# Patient Record
Sex: Female | Born: 1965
Health system: Southern US, Community
[De-identification: ages and names within clinical notes are randomized; demographics above are authoritative.]

## PROBLEM LIST (undated history)

## (undated) DIAGNOSIS — T7840XA Allergy, unspecified, initial encounter: Secondary | ICD-10-CM

## (undated) DIAGNOSIS — I872 Venous insufficiency (chronic) (peripheral): Secondary | ICD-10-CM

## (undated) DIAGNOSIS — F32A Depression, unspecified: Secondary | ICD-10-CM

## (undated) DIAGNOSIS — R519 Headache, unspecified: Secondary | ICD-10-CM

## (undated) DIAGNOSIS — F329 Major depressive disorder, single episode, unspecified: Secondary | ICD-10-CM

## (undated) DIAGNOSIS — K5792 Diverticulitis of intestine, part unspecified, without perforation or abscess without bleeding: Secondary | ICD-10-CM

## (undated) DIAGNOSIS — K589 Irritable bowel syndrome without diarrhea: Secondary | ICD-10-CM

## (undated) DIAGNOSIS — R51 Headache: Secondary | ICD-10-CM

## (undated) HISTORY — DX: Major depressive disorder, single episode, unspecified: F32.9

## (undated) HISTORY — DX: Depression, unspecified: F32.A

## (undated) HISTORY — DX: Allergy, unspecified, initial encounter: T78.40XA

## (undated) HISTORY — DX: Irritable bowel syndrome, unspecified: K58.9

---

## 1990-04-17 HISTORY — PX: BACK SURGERY: SHX140

## 1991-04-18 LAB — HM PAP SMEAR: HM Pap smear: NORMAL

## 2013-05-19 ENCOUNTER — Ambulatory Visit: Payer: Self-pay | Admitting: Family Medicine

## 2013-05-21 ENCOUNTER — Encounter: Payer: Self-pay | Admitting: Family Medicine

## 2013-05-21 ENCOUNTER — Ambulatory Visit: Payer: Self-pay | Admitting: Family Medicine

## 2013-05-21 ENCOUNTER — Ambulatory Visit (INDEPENDENT_AMBULATORY_CARE_PROVIDER_SITE_OTHER): Payer: BC Managed Care – PPO | Admitting: Family Medicine

## 2013-05-21 ENCOUNTER — Encounter (INDEPENDENT_AMBULATORY_CARE_PROVIDER_SITE_OTHER): Payer: Self-pay

## 2013-05-21 VITALS — BP 108/72 | HR 77 | Ht 70.0 in | Wt 275.0 lb

## 2013-05-21 DIAGNOSIS — M79609 Pain in unspecified limb: Secondary | ICD-10-CM

## 2013-05-21 DIAGNOSIS — M79672 Pain in left foot: Secondary | ICD-10-CM

## 2013-05-21 DIAGNOSIS — M79671 Pain in right foot: Secondary | ICD-10-CM

## 2013-05-21 NOTE — Progress Notes (Signed)
Patient ID: Rachael Gonzales, female   DOB: 15-Oct-1965, 48 y.o.   MRN: [id number]  PCP: No primary provider on file.  Subjective:   HPI: Patient is a 48 y.o. female here for bilateral heel/foot pain.  Patient denies acute injury. States she has had progressively worsening bilateral heel pain over past 6 weeks. Tried ibuprofen. Left currently worse than the right. No other treatment tried.  History reviewed. No pertinent past medical history.  No current outpatient prescriptions on file prior to visit.   No current facility-administered medications on file prior to visit.    Past Surgical History  Procedure Laterality Date  . Cesarean section    . Back surgery      L5-S1    No Known Allergies  History   Social History  . Marital Status: Significant Other    Spouse Name: N/A    Number of Children: N/A  . Years of Education: N/A   Occupational History  . Not on file.   Social History Main Topics  . Smoking status: Former Research scientist (life sciences)  . Smokeless tobacco: Not on file  . Alcohol Use: Not on file  . Drug Use: Not on file  . Sexual Activity: Not on file   Other Topics Concern  . Not on file   Social History Narrative  . No narrative on file    Family History  Problem Relation Age of Onset  . Sudden death Neg Hx   . Hypertension Neg Hx   . Heart attack Neg Hx   . Diabetes Neg Hx   . Hyperlipidemia Neg Hx     BP 108/72  Pulse 77  Ht 5\' 10"  (1.778 m)  Wt 275 lb (124.739 kg)  BMI 39.46 kg/m2  Review of Systems: See HPI above.    Objective:  Physical Exam:  Gen: NAD  Bilateral feet/ankles: Overpronation. No other gross deformity, swelling, ecchymoses FROM ankles with 5/5 strength all directions. TTP achilles near insertion and proximal plantar fascia. Negative ant drawer and talar tilt.   Negative syndesmotic compression. Thompsons test negative. NV intact distally.    Assessment & Plan:  1. 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  Start home exercise program.  Icing, nsaids.  Avoid flat shoes/barefoot walking.  Heel lifts, better arch support.  Consider PF injection, PT if not improving.  F/u in 6 weeks.

## 2013-05-21 NOTE — Patient Instructions (Signed)
You have a combination of achilles tendinitis and plantar fasciitis Take tylenol or aleve as needed for pain  Achilles stretch against wall and plantar fascia stretch for 20-30 seconds (do 3 of these) in morning Calf raises 3 sets of 10 once a day (start with both feet - when tolerated do a single leg calf raise.  Finally when tolerated raise/lower on a step) Can add heel walks, toe walks forward and backward as well Ice heel for 15 minutes as needed. Avoid flat shoes/barefoot walking as much as possible. Arch straps have been shown to help with pain. Heel lifts may help with pain by avoiding fully stretching the plantar fascia. Orthotics with heel lift may be helpful (green insoles or dr. Zoe Lan active series insoles). Steroid injection is a consideration for short term pain relief if you are struggling. Physical therapy is also an option. Follow up with me in 6 weeks for reevaluation.

## 2013-05-26 DIAGNOSIS — M79672 Pain in left foot: Secondary | ICD-10-CM | POA: Insufficient documentation

## 2013-05-26 DIAGNOSIS — M79671 Pain in right foot: Secondary | ICD-10-CM | POA: Insufficient documentation

## 2013-05-26 NOTE — Assessment & Plan Note (Signed)
elements of both achilles tendinopathy and plantar fasciitis.  Start home exercise program.  Icing, nsaids.  Avoid flat shoes/barefoot walking.  Heel lifts, better arch support.  Consider PF injection, PT if not improving.  F/u in 6 weeks.

## 2013-07-02 ENCOUNTER — Encounter: Payer: Self-pay | Admitting: Family Medicine

## 2013-07-02 ENCOUNTER — Ambulatory Visit (INDEPENDENT_AMBULATORY_CARE_PROVIDER_SITE_OTHER): Payer: BC Managed Care – PPO | Admitting: Family Medicine

## 2013-07-02 ENCOUNTER — Ambulatory Visit: Payer: BC Managed Care – PPO | Admitting: Family Medicine

## 2013-07-02 VITALS — BP 102/68 | HR 76 | Ht 70.0 in | Wt 280.0 lb

## 2013-07-02 DIAGNOSIS — M7710 Lateral epicondylitis, unspecified elbow: Secondary | ICD-10-CM

## 2013-07-02 DIAGNOSIS — M79609 Pain in unspecified limb: Secondary | ICD-10-CM

## 2013-07-02 DIAGNOSIS — M79671 Pain in right foot: Secondary | ICD-10-CM

## 2013-07-02 DIAGNOSIS — M79672 Pain in left foot: Secondary | ICD-10-CM

## 2013-07-02 DIAGNOSIS — M7712 Lateral epicondylitis, left elbow: Secondary | ICD-10-CM

## 2013-07-02 NOTE — Patient Instructions (Signed)
You have lateral epicondylitis Try to avoid painful activities as much as possible. Ice the area 3-4 times a day for 15 minutes at a time. Tylenol or aleve as needed for pain. Counterforce brace as directed can help unload area - wear this regularly if it provides you with relief. Home Pronation/supination with 1 pound weight, wrist extension, stretching - do these once a day. Consider formal PT. Consider injection for short term pain relief if the above is not helping. Follow up with me in 6 weeks or as needed.

## 2013-07-04 ENCOUNTER — Encounter: Payer: Self-pay | Admitting: Family Medicine

## 2013-07-04 DIAGNOSIS — M7712 Lateral epicondylitis, left elbow: Secondary | ICD-10-CM | POA: Insufficient documentation

## 2013-07-04 NOTE — Progress Notes (Signed)
Patient ID: [name], female   DOB: 1965/09/18, 48 y.o.   MRN: [id number]  PCP: No primary provider on file.  Subjective:   HPI: Patient is a 48 y.o. female here for bilateral heel/foot pain.  2/4: Patient denies acute injury. States she has had progressively worsening bilateral heel pain over past 6 weeks. Tried ibuprofen. Left currently worse than the right. No other treatment tried.  3/8: Patient reports her heels have improved - still some pain in left heel Doing home exercises, inserts, arch binders, icing. Takes motrin three times a day. Getting pain in lateral left elbow and forearm now. Gets swelling into hand in mornings after she wears compression wrap. Not had anything like this before.  History reviewed. No pertinent past medical history.  No current outpatient prescriptions on file prior to visit.   No current facility-administered medications on file prior to visit.    Past Surgical History  Procedure Laterality Date  . Cesarean section    . Back surgery      L5-S1    No Known Allergies  History   Social History  . Marital Status: Significant Other    Spouse Name: N/A    Number of Children: N/A  . Years of Education: N/A   Occupational History  . Not on file.   Social History Main Topics  . Smoking status: Former Research scientist (life sciences)  . Smokeless tobacco: Not on file  . Alcohol Use: Not on file  . Drug Use: Not on file  . Sexual Activity: Not on file   Other Topics Concern  . Not on file   Social History Narrative  . No narrative on file    Family History  Problem Relation Age of Onset  . Sudden death Neg Hx   . Hypertension Neg Hx   . Heart attack Neg Hx   . Diabetes Neg Hx   . Hyperlipidemia Neg Hx     BP 102/68  Pulse 76  Ht 5\' 10"  (1.778 m)  Wt 280 lb (127.007 kg)  BMI 40.18 kg/m2  Review of Systems: See HPI above.    Objective:  Physical Exam:  Gen: NAD  Bilateral feet/ankles: Overpronation. No other gross deformity,  swelling, ecchymoses FROM ankles with 5/5 strength all directions. No TTP achilles near insertion.  Minimal tenderness proximal plantar fascia. Thompsons test negative. NV intact distally.  Left elbow: No gross deformity, swelling, bruising. TTP lateral epicondyle. Pain reproduced with resisted wrist extension, less with 3rd digit extensions. NVI distally. Collateral ligaments intact.    Assessment & Plan:  1. Bilateral heel pain - elements of both achilles tendinopathy and plantar fasciitis.  Much improved.  Continue HEP, icing, nsaids.  Avoid flat shoes/barefoot walking.  Heel lifts, better arch support.  Consider PF injection, PT if not improving.  F/u prn for this issue.  2. Lateral epicondylitis - Icing, nsaids.  Counterforce brace.  Home exercise program reviewed.  Consider physical therapy, nitro patches, injection if not improving.  F/u in 6 weeks or prn.

## 2013-07-04 NOTE — Assessment & Plan Note (Signed)
Icing, nsaids.  Counterforce brace.  Home exercise program reviewed.  Consider physical therapy, nitro patches, injection if not improving.  F/u in 6 weeks or prn.

## 2013-07-04 NOTE — Assessment & Plan Note (Signed)
elements of both achilles tendinopathy and plantar fasciitis.  Much improved.  Continue HEP, icing, nsaids.  Avoid flat shoes/barefoot walking.  Heel lifts, better arch support.  Consider PF injection, PT if not improving.  F/u prn for this issue.

## 2013-08-08 ENCOUNTER — Encounter: Payer: Self-pay | Admitting: Family Medicine

## 2013-08-08 ENCOUNTER — Ambulatory Visit (INDEPENDENT_AMBULATORY_CARE_PROVIDER_SITE_OTHER): Payer: Commercial Managed Care - PPO | Admitting: Family Medicine

## 2013-08-08 VITALS — BP 114/73 | HR 60 | Ht 69.0 in | Wt 280.0 lb

## 2013-08-08 DIAGNOSIS — M79671 Pain in right foot: Secondary | ICD-10-CM

## 2013-08-08 DIAGNOSIS — M79609 Pain in unspecified limb: Secondary | ICD-10-CM

## 2013-08-08 DIAGNOSIS — M79672 Pain in left foot: Secondary | ICD-10-CM

## 2013-08-11 ENCOUNTER — Encounter: Payer: Self-pay | Admitting: Family Medicine

## 2013-08-11 NOTE — Assessment & Plan Note (Signed)
elements of both achilles tendinopathy and plantar fasciitis though PF primary issue on left now.  Dr. Zoe Lan inserts have given her too much arch support on left I believe.  Switch to sports insoles with a lateral heel wedge.  She appears more neutral and felt much more comfortable with these.  Declined injection for PF.  Continue HEP, icing, nsaids.  Avoid flat shoes/barefoot walking.  Heel lifts, better arch support.  F/u prn.

## 2013-08-11 NOTE — Progress Notes (Signed)
Patient ID: Rachael Gonzales, female   DOB: 11-Apr-1966, 48 y.o.   MRN: [id number]  PCP: No primary provider on file.  Subjective:   HPI: Patient is a 48 y.o. female here for bilateral heel/foot pain.  2/4: Patient denies acute injury. States she has had progressively worsening bilateral heel pain over past 6 weeks. Tried ibuprofen. Left currently worse than the right. No other treatment tried.  3/8: Patient reports her heels have improved - still some pain in left heel Doing home exercises, inserts, arch binders, icing. Takes motrin three times a day. Getting pain in lateral left elbow and forearm now. Gets swelling into hand in mornings after she wears compression wrap. Not had anything like this before.  4/24: Patient reports left elbow improved and right heel improved. Left heel still painful. Feels like this foot turns outwards with dr. Zoe Lan insoles and feels like may roll ankle (and has almost done so). Overall feels better though. Doing home exercises, arch binders.  History reviewed. No pertinent past medical history.  No current outpatient prescriptions on file prior to visit.   No current facility-administered medications on file prior to visit.    Past Surgical History  Procedure Laterality Date  . Cesarean section    . Back surgery      L5-S1    No Known Allergies  History   Social History  . Marital Status: Significant Other    Spouse Name: N/A    Number of Children: N/A  . Years of Education: N/A   Occupational History  . Not on file.   Social History Main Topics  . Smoking status: Former Research scientist (life sciences)  . Smokeless tobacco: Not on file  . Alcohol Use: Not on file  . Drug Use: Not on file  . Sexual Activity: Not on file   Other Topics Concern  . Not on file   Social History Narrative  . No narrative on file    Family History  Problem Relation Age of Onset  . Sudden death Neg Hx   . Hypertension Neg Hx   . Heart attack Neg Hx   .  Diabetes Neg Hx   . Hyperlipidemia Neg Hx     BP 114/73  Pulse 60  Ht 5\' 9"  (1.753 m)  Wt 280 lb (127.007 kg)  BMI 41.33 kg/m2  Review of Systems: See HPI above.    Objective:  Physical Exam:  Gen: NAD  Bilateral feet/ankles: Overpronation. No other gross deformity, swelling, ecchymoses FROM ankles with 5/5 strength all directions. No TTP achilles near insertion.  Minimal tenderness proximal plantar fascia on left. Thompsons test negative. NV intact distally.    Assessment & Plan:  1. Bilateral heel pain - elements of both achilles tendinopathy and plantar fasciitis though PF primary issue on left now.  Dr. Zoe Lan inserts have given her too much arch support on left I believe.  Switch to sports insoles with a lateral heel wedge.  She appears more neutral and felt much more comfortable with these.  Declined injection for PF.  Continue HEP, icing, nsaids.  Avoid flat shoes/barefoot walking.  Heel lifts, better arch support.  F/u prn.

## 2013-10-30 LAB — LIPID PANEL
CHOLESTEROL: 218 mg/dL — AB (ref 0–200)
HDL: 48 mg/dL (ref 35–70)
LDL CALC: 151 mg/dL
TRIGLYCERIDES: 96 mg/dL (ref 40–160)

## 2013-10-30 LAB — BASIC METABOLIC PANEL
Creatinine: 0.5 mg/dL (ref <=1.1)
GLUCOSE: 95 mg/dL

## 2013-10-30 LAB — TSH: TSH: 1.27 u[IU]/mL (ref <=5.90)

## 2014-03-24 ENCOUNTER — Ambulatory Visit: Payer: Self-pay | Admitting: Family Medicine

## 2014-03-24 LAB — HM MAMMOGRAPHY: HM Mammogram: NORMAL

## 2014-04-08 ENCOUNTER — Ambulatory Visit: Payer: Self-pay | Admitting: Family Medicine

## 2014-08-14 ENCOUNTER — Encounter: Payer: Self-pay | Admitting: Family Medicine

## 2014-08-14 DIAGNOSIS — F430 Acute stress reaction: Secondary | ICD-10-CM | POA: Insufficient documentation

## 2014-08-14 DIAGNOSIS — IMO0002 Reserved for concepts with insufficient information to code with codable children: Secondary | ICD-10-CM | POA: Insufficient documentation

## 2014-08-14 DIAGNOSIS — K589 Irritable bowel syndrome without diarrhea: Secondary | ICD-10-CM | POA: Insufficient documentation

## 2014-09-16 ENCOUNTER — Ambulatory Visit (INDEPENDENT_AMBULATORY_CARE_PROVIDER_SITE_OTHER): Payer: 59 | Admitting: Family Medicine

## 2014-09-16 ENCOUNTER — Encounter: Payer: Self-pay | Admitting: Family Medicine

## 2014-09-16 VITALS — BP 120/80 | HR 62 | Ht 69.0 in | Wt 287.0 lb

## 2014-09-16 DIAGNOSIS — F329 Major depressive disorder, single episode, unspecified: Secondary | ICD-10-CM

## 2014-09-16 DIAGNOSIS — F32A Depression, unspecified: Secondary | ICD-10-CM

## 2014-09-16 DIAGNOSIS — F418 Other specified anxiety disorders: Secondary | ICD-10-CM | POA: Diagnosis not present

## 2014-09-16 DIAGNOSIS — F419 Anxiety disorder, unspecified: Principal | ICD-10-CM

## 2014-09-16 MED ORDER — CLONAZEPAM 0.5 MG PO TABS: 0.5000 mg | ORAL_TABLET | ORAL | Status: DC

## 2014-09-16 MED ORDER — SERTRALINE HCL 50 MG PO TABS: 50.0000 mg | ORAL_TABLET | Freq: Every day | ORAL | Status: DC

## 2014-09-16 NOTE — Progress Notes (Signed)
Name: Rachael Gonzales   MRN: [id number]    DOB: 02-13-1966   Date:09/16/2014       Progress Note  Subjective  Chief Complaint  Chief Complaint  Patient presents with  . Depression    follow up on Zoloft    Anxiety Presents for follow-up visit. Onset was 1 to 6 months ago. The problem has been gradually improving. Symptoms include insomnia and nervous/anxious behavior. Patient reports no chest pain, confusion, decreased concentration, depressed mood, dizziness, excessive worry, irritability, malaise, nausea, obsessions or palpitations. Primary symptoms comment: episodic. Symptoms occur most days. The severity of symptoms is mild. The symptoms are aggravated by work stress (out of work/interview stage). The quality of sleep is fair.   There are no known risk factors. Her past medical history is significant for depression. Past treatments include benzodiazephines and SSRIs. The treatment provided moderate relief. Compliance with prior treatments has been good.     No problem-specific assessment & plan notes found for this encounter.   Past Medical History  Diagnosis Date  . Allergy   . Depression   . Irritable bowel     Past Surgical History  Procedure Laterality Date  . Cesarean section    . Back surgery  04/17/1990    L5-S1    Family History  Problem Relation Age of Onset  . Sudden death Neg Hx   . Hypertension Neg Hx   . Heart attack Neg Hx   . Diabetes Neg Hx   . Hyperlipidemia Neg Hx   . COPD Mother   . Asthma Mother   . Cancer Father     History   Social History  . Marital Status: Significant Other    Spouse Name: N/A  . Number of Children: N/A  . Years of Education: N/A   Occupational History  . Not on file.   Social History Main Topics  . Smoking status: Current Some Day Smoker  . Smokeless tobacco: Not on file  . Alcohol Use: 0.0 oz/week    0 Standard drinks or equivalent per week  . Drug Use: No  . Sexual Activity: Not on file   Other Topics  Concern  . Not on file   Social History Narrative     Current outpatient prescriptions:  .  clonazePAM (KLONOPIN) 0.5 MG tablet, Take 1 tablet (0.5 mg total) by mouth 1 day or 1 dose., Disp: 15 tablet, Rfl: 0 .  dicyclomine (BENTYL) 10 MG capsule, Take 1 capsule by mouth 3 (three) times daily., Disp: , Rfl:  .  loratadine (CLARITIN) 10 MG tablet, Take 1 tablet by mouth 1 day or 1 dose., Disp: , Rfl:  .  Multiple Vitamins-Minerals (MULTIVITAMIN & MINERAL) LIQD, Take 1 tablet by mouth 1 day or 1 dose. pill, Disp: , Rfl:  .  sertraline (ZOLOFT) 50 MG tablet, Take 1 tablet (50 mg total) by mouth daily., Disp: 30 tablet, Rfl: 6  No Known Allergies   Review of Systems  Constitutional: Negative.  Negative for irritability.  Respiratory: Negative for cough and wheezing.   Cardiovascular: Negative for chest pain and palpitations.  Gastrointestinal: Negative for heartburn and nausea.  Genitourinary: Negative for urgency and frequency.       No urgency  Neurological: Negative for dizziness and headaches.       Noheadache  Psychiatric/Behavioral: Positive for depression. Negative for confusion and decreased concentration. The patient is nervous/anxious and has insomnia.       Objective  Filed Vitals:   09/16/14 1008  BP: 120/80  Pulse: 62  Height: 5\' 9"  (1.753 m)  Weight: 287 lb (130.182 kg)    Physical Exam  Constitutional: She is oriented to person, place, and time and well-developed, well-nourished, and in no distress.  HENT:  Head: Normocephalic.  Mouth/Throat: Oropharynx is clear and moist.  Eyes: Conjunctivae are normal. Pupils are equal, round, and reactive to light.  Neck: Normal range of motion. Neck supple. No thyromegaly present.  Cardiovascular: Normal rate and normal heart sounds.  Exam reveals no gallop and no friction rub.   No murmur heard. Pulmonary/Chest: Effort normal and breath sounds normal.  Abdominal: Soft. There is no hepatosplenomegaly. There is no  tenderness.  Neurological: She is alert and oriented to person, place, and time.  Psychiatric: Mood and affect normal.    No results found for this or any previous visit (from the past 2160 hour(s)).   Assessment & Plan  Problem List Items Addressed This Visit    None    Visit Diagnoses    Anxiety and depression    -  Primary    Relevant Medications    clonazePAM (KLONOPIN) 0.5 MG tablet    sertraline (ZOLOFT) 50 MG tablet       Meds ordered this encounter  Medications  . clonazePAM (KLONOPIN) 0.5 MG tablet    Sig: Take 1 tablet (0.5 mg total) by mouth 1 day or 1 dose.    Dispense:  15 tablet    Refill:  0  . sertraline (ZOLOFT) 50 MG tablet    Sig: Take 1 tablet (50 mg total) by mouth daily.    Dispense:  30 tablet    Refill:  6

## 2014-10-20 ENCOUNTER — Other Ambulatory Visit: Payer: Self-pay

## 2014-12-27 ENCOUNTER — Ambulatory Visit
Admission: EM | Admit: 2014-12-27 | Discharge: 2014-12-27 | Disposition: A | Discharge status: Home / Self Care | Payer: Commercial Managed Care - PPO | Attending: Family Medicine | Admitting: Family Medicine

## 2014-12-27 DIAGNOSIS — J029 Acute pharyngitis, unspecified: Secondary | ICD-10-CM

## 2014-12-27 DIAGNOSIS — L309 Dermatitis, unspecified: Secondary | ICD-10-CM

## 2014-12-27 DIAGNOSIS — J3081 Allergic rhinitis due to animal (cat) (dog) hair and dander: Secondary | ICD-10-CM | POA: Diagnosis not present

## 2014-12-27 MED ORDER — AMOXICILLIN-POT CLAVULANATE 875-125 MG PO TABS: 1.0000 | ORAL_TABLET | Freq: Two times a day (BID) | ORAL | Status: DC

## 2014-12-27 MED ORDER — BENZONATATE 200 MG PO CAPS: 200.0000 mg | ORAL_CAPSULE | Freq: Three times a day (TID) | ORAL | Status: DC | PRN

## 2014-12-27 MED ORDER — HYDROCOD POLST-CPM POLST ER 10-8 MG/5ML PO SUER: 5.0000 mL | Freq: Every evening | ORAL | Status: DC | PRN

## 2014-12-27 NOTE — ED Provider Notes (Signed)
CSN: [id number]     Arrival date & time 12/27/14  1884 History   First MD Initiated Contact with Patient 12/27/14 [phone]     Chief Complaint  Patient presents with  . Cough  . Sore Throat  . Generalized Body Aches   (Consider location/radiation/quality/duration/timing/severity/associated sxs/prior Treatment) HPI Comments: Married caucasian female works for [employer] In from home.  Spouse ER nurse sick with similar symptoms.  Allergic to pet dander has cats and dogs in home currently not sleeping with them in bed.  Has tried mucinex, claritin, brandy with honey and lemon without any relief of symptoms.  Seasonal allergies usually flare with ragweed season also (currently in progress)  PCM Dr Ronnald Ramp going to see her for titration off zoloft. Eczema flare currently on palm of hands.  Patient is a 49 y.o. female presenting with cough and pharyngitis. The history is provided by the patient and the spouse.  Cough Cough characteristics:  Non-productive Severity:  Moderate Onset quality:  Sudden Duration:  3 days Timing:  Constant Progression:  Worsening Chronicity:  New Smoker: no   Context: animal exposure, exposure to allergens, sick contacts, upper respiratory infection and weather changes   Context: not occupational exposure   Relieved by:  Nothing Worsened by:  Lying down, activity, environmental changes and deep breathing Ineffective treatments:  Decongestant, rest, fluids and cough suppressants Associated symptoms: chills, myalgias, rash, rhinorrhea and sore throat   Associated symptoms: no chest pain, no diaphoresis, no ear fullness, no ear pain, no eye discharge, no fever, no headaches, no shortness of breath, no sinus congestion, no weight loss and no wheezing   Myalgias:    Location:  Generalized   Quality:  Aching   Severity:  Moderate   Onset quality:  Sudden   Duration:  3 days   Timing:  Constant   Progression:  Unchanged Rhinorrhea:    Quality:  White   Severity:  Moderate   Duration:  3 days   Timing:  Constant   Progression:  Unchanged Sore throat:    Severity:  Moderate   Onset quality:  Sudden   Duration:  3 days   Timing:  Constant   Progression:  Unchanged Risk factors: recent infection   Risk factors: no chemical exposure and no recent travel   Sore Throat Pertinent negatives include no chest pain, no abdominal pain, no headaches and no shortness of breath.    Past Medical History  Diagnosis Date  . Allergy   . Depression   . Irritable bowel    Past Surgical History  Procedure Laterality Date  . Cesarean section    . Back surgery  04/17/1990    L5-S1   Family History  Problem Relation Age of Onset  . Sudden death Neg Hx   . Hypertension Neg Hx   . Heart attack Neg Hx   . Diabetes Neg Hx   . Hyperlipidemia Neg Hx   . COPD Mother   . Asthma Mother   . Cancer Father    Social History  Substance Use Topics  . Smoking status: Current Some Day Smoker  . Smokeless tobacco: None  . Alcohol Use: 0.0 oz/week    0 Standard drinks or equivalent per week   OB History    No data available     Review of Systems  Constitutional: Positive for chills and fatigue. Negative for fever, weight loss, diaphoresis, activity change and appetite change.  HENT: Positive for congestion, postnasal drip, rhinorrhea, sneezing, sore throat and voice  change. Negative for dental problem, drooling, ear discharge, ear pain, facial swelling, hearing loss, mouth sores, nosebleeds, sinus pressure, tinnitus and trouble swallowing.   Eyes: Negative for photophobia, pain, discharge, redness, itching and visual disturbance.  Respiratory: Positive for cough. Negative for choking, chest tightness, shortness of breath, wheezing and stridor.   Cardiovascular: Negative for chest pain.  Gastrointestinal: Negative for nausea, vomiting, abdominal pain, diarrhea, constipation, blood in stool and abdominal distention.  Endocrine: Negative for cold intolerance and heat  intolerance.  Genitourinary: Negative for dysuria.  Musculoskeletal: Positive for myalgias. Negative for back pain, joint swelling, arthralgias, gait problem, neck pain and neck stiffness.  Skin: Positive for rash. Negative for color change, pallor and wound.  Allergic/Immunologic: Positive for environmental allergies. Negative for food allergies.  Neurological: Negative for dizziness, tremors, seizures, syncope, facial asymmetry, speech difficulty, weakness, light-headedness, numbness and headaches.  Hematological: Negative for adenopathy. Does not bruise/bleed easily.  Psychiatric/Behavioral: Positive for sleep disturbance. Negative for suicidal ideas, behavioral problems, confusion and agitation. The patient is not nervous/anxious and is not hyperactive.     Allergies  Review of patient's allergies indicates no known allergies.  Home Medications   Prior to Admission medications   Medication Sig Start Date End Date Taking? Authorizing Provider  amoxicillin-clavulanate (AUGMENTIN) 875-125 MG per tablet Take 1 tablet by mouth every 12 (twelve) hours. 12/27/14   Olen Cordial, NP  benzonatate (TESSALON) 200 MG capsule Take 1 capsule (200 mg total) by mouth 3 (three) times daily as needed for cough. 12/27/14   Olen Cordial, NP  chlorpheniramine-HYDROcodone (TUSSIONEX PENNKINETIC ER) 10-8 MG/5ML SUER Take 5 mLs by mouth at bedtime as needed for cough. 12/27/14   Olen Cordial, NP  dicyclomine (BENTYL) 10 MG capsule Take 1 capsule by mouth 3 (three) times daily. 04/13/14   Historical Provider, MD  loratadine (CLARITIN) 10 MG tablet Take 1 tablet by mouth 1 day or 1 dose.    Historical Provider, MD  Multiple Vitamins-Minerals (MULTIVITAMIN & MINERAL) LIQD Take 1 tablet by mouth 1 day or 1 dose. pill    Historical Provider, MD  sertraline (ZOLOFT) 50 MG tablet Take 1 tablet (50 mg total) by mouth daily. 09/16/14   [name], MD   Meds Ordered and Administered this Visit  Medications  - No data to display  BP 123/59 mmHg  Pulse 60  Temp(Src) 97.1 F (36.2 C) (Tympanic)  Resp 20  Ht 5\' 9"  (1.753 m)  Wt 290 lb (131.543 kg)  BMI 42.81 kg/m2  SpO2 99% No data found.   Physical Exam  Constitutional: She is oriented to person, place, and time. Vital signs are normal. She appears well-developed and well-nourished. No distress.  HENT:  Head: Normocephalic and atraumatic.  Right Ear: Hearing, external ear and ear canal normal. A middle ear effusion is present.  Left Ear: Hearing, external ear and ear canal normal. A middle ear effusion is present.  Nose: Mucosal edema and rhinorrhea present. No nose lacerations, sinus tenderness, nasal deformity, septal deviation or nasal septal hematoma. No epistaxis.  No foreign bodies. Right sinus exhibits no maxillary sinus tenderness and no frontal sinus tenderness. Left sinus exhibits no maxillary sinus tenderness and no frontal sinus tenderness.  Mouth/Throat: Uvula is midline. Mucous membranes are not pale, not dry and not cyanotic. She does not have dentures. No oral lesions. No trismus in the jaw. Normal dentition. No dental abscesses, uvula swelling, lacerations or dental caries. Posterior oropharyngeal edema and posterior oropharyngeal erythema present. No oropharyngeal  exudate or tonsillar abscesses.  Cobblestoning posterior pharynx; nasal turbinates with edema/erythema clear discharge bilaterally; frequent nonproductive cough in exam room; hoarse voice; bilateral TMs with air fluid level clear  Eyes: Conjunctivae, EOM and lids are normal. Pupils are equal, round, and reactive to light. Right eye exhibits no discharge. Left eye exhibits no discharge. No scleral icterus.  Neck: Trachea normal and normal range of motion. Neck supple. No tracheal deviation present. No thyromegaly present.  Cardiovascular: Normal rate, regular rhythm, S1 normal, S2 normal, normal heart sounds and intact distal pulses.  PMI is not displaced.  Exam reveals  no gallop and no friction rub.   No murmur heard. Pulmonary/Chest: Effort normal and breath sounds normal. No accessory muscle usage or stridor. No respiratory distress. She has no decreased breath sounds. She has no wheezes. She has no rhonchi. She has no rales.  Abdominal: Soft. She exhibits no distension.  Musculoskeletal: Normal range of motion. She exhibits no edema or tenderness.  Lymphadenopathy:    She has no cervical adenopathy.  Neurological: She is alert and oriented to person, place, and time. She exhibits normal muscle tone. Coordination normal.  Skin: Skin is warm, dry and intact. Rash noted. No abrasion, no bruising, no burn, no ecchymosis, no laceration, no petechiae and no purpura noted. Rash is not macular, not papular, not maculopapular, not nodular, not pustular, not vesicular and not urticarial. She is not diaphoretic. No cyanosis or erythema. No pallor. Nails show no clubbing.     Lichenification/scale palmar surface bilateral hands distal to interdigital space dry no erythema does not extend past MCP joints  Psychiatric: She has a normal mood and affect. Her speech is normal and behavior is normal. Judgment and thought content normal. Cognition and memory are normal.  Nursing note and vitals reviewed.   ED Course  Procedures (including critical care time)  Labs Review Labs Reviewed - No data to display  Imaging Review No results found.   MDM   1. Acute pharyngitis, unspecified pharyngitis type   2. Allergic rhinitis due to animal hair and dander   3. Dermatitis    Postnasal drip causing sore throat.  Wife sick with similar symptoms.  Tessalon pearles during the day and Tussionex at night.  Avoid alcohol intake before or after tussionex and driving after taking tussionex   Usually no specific medical treatment is needed if a virus is causing the sore throat.  The throat most often gets better on its own within 5 to 7 days.  Antibiotic medicine does not cure viral  pharyngitis.   For acute pharyngitis caused by bacteria, your healthcare provider will prescribe an antibiotic.  Marland Kitchen Do not smoke.  Marland Kitchen Avoid secondhand smoke and other air pollutants.  . Use a cool mist humidifier to add moisture to the air.  . Get plenty of rest.  . You may want to rest your throat by talking less and eating a diet that is mostly liquid or soft for a day or two.   Marland Kitchen Nonprescription throat lozenges and mouthwashes should help relieve the soreness.   . Gargling with warm saltwater and drinking warm liquids may help.  (You can make a saltwater solution by adding 1/4 teaspoon of salt to 8 ounces, or 240 mL, of warm water.)  . A nonprescription pain reliever such as aspirin, acetaminophen, or ibuprofen may ease general aches and pains.   FOLLOW UP with clinic provider if no improvements in the next 7-10 days.  Patient verbalized understanding of instructions  and agreed with plan of care. P2:  Hand washing and diet.  Patient refused nose sprays.  Discussed flonase and nasal saline typically improve rhinitis/sinus inflammation better than decongestant and/or antibiotics.  Patient hates nose sprays/refused verbalized understanding of information.  Will try to use her neti pot at home when in the shower.  Continue claritin.  Tussionex at bedtime.  Launder bed linens more frequently as cats and dogs in house.  Flared due to ragweed season.  Claritin not strong enough.  Patient may use normal saline nasal spray as needed.  Augmentin 875mg  po BID x 10 days if new/worsening sinus pain/pressure/fever lasts more than a couple days despite neti pot use.  Avoid triggers if possible.  Shower prior to bedtime if exposed to triggers.  If allergic dust/dust mites recommend mattress/pillow covers/encasements; washing linens, vacuuming, sweeping, dusting weekly.  Call or return to clinic as needed if these symptoms worsen or fail to improve as anticipated.   Exitcare handout on allergic rhinitis given to  patient.  Patient verbalized understanding of instructions, agreed with plan of care and had no further questions at this time.  P2:  Avoidance and hand washing.  Patient currently not using emollient only hydrocortisone cream topical prn hands.  Discussed emollient use BID recommended and then if needed apply hydrocortisone and cover with emollient  Medication as directed.  Symptomatic therapy suggested.  Warm to cool water soaks and/or oatmeal baths.  Call or return to clinic as needed if these symptoms worsen or fail to improve as anticipated.    Patient verbalized agreement and understanding of treatment plan.   P2:  Avoidance and hand washing.  Olen Cordial, NP 12/27/14 [phone]

## 2014-12-27 NOTE — Discharge Instructions (Signed)
Allergic Rhinitis Allergic rhinitis is when the mucous membranes in the nose respond to allergens. Allergens are particles in the air that cause your body to have an allergic reaction. This causes you to release allergic antibodies. Through a chain of events, these eventually cause you to release histamine into the blood stream. Although meant to protect the body, it is this release of histamine that causes your discomfort, such as frequent sneezing, congestion, and an itchy, runny nose.  CAUSES  Seasonal allergic rhinitis (hay fever) is caused by pollen allergens that may come from grasses, trees, and weeds. Year-round allergic rhinitis (perennial allergic rhinitis) is caused by allergens such as house dust mites, pet dander, and mold spores.  SYMPTOMS   Nasal stuffiness (congestion).  Itchy, runny nose with sneezing and tearing of the eyes. DIAGNOSIS  Your health care provider can help you determine the allergen or allergens that trigger your symptoms. If you and your health care provider are unable to determine the allergen, skin or blood testing may be used. TREATMENT  Allergic rhinitis does not have a cure, but it can be controlled by:  Medicines and allergy shots (immunotherapy).  Avoiding the allergen. Hay fever may often be treated with antihistamines in pill or nasal spray forms. Antihistamines block the effects of histamine. There are over-the-counter medicines that may help with nasal congestion and swelling around the eyes. Check with your health care provider before taking or giving this medicine.  If avoiding the allergen or the medicine prescribed do not work, there are many new medicines your health care provider can prescribe. Stronger medicine may be used if initial measures are ineffective. Desensitizing injections can be used if medicine and avoidance does not work. Desensitization is when a patient is given ongoing shots until the body becomes less sensitive to the allergen.  Make sure you follow up with your health care provider if problems continue. HOME CARE INSTRUCTIONS It is not possible to completely avoid allergens, but you can reduce your symptoms by taking steps to limit your exposure to them. It helps to know exactly what you are allergic to so that you can avoid your specific triggers. SEEK MEDICAL CARE IF:   You have a fever.  You develop a cough that does not stop easily (persistent).  You have shortness of breath.  You start wheezing.  Symptoms interfere with normal daily activities. Document Released: 12/27/2000 Document Revised: 04/08/2013 Document Reviewed: 12/09/2012 [facility] Patient Information 2015 [place], [place]. This information is not intended to replace advice given to you by your health care provider. Make sure you discuss any questions you have with your health care provider. Otitis Media With Effusion Otitis media with effusion is the presence of fluid in the middle ear. This is a common problem in children, which often follows ear infections. It may be present for weeks or longer after the infection. Unlike an acute ear infection, otitis media with effusion refers only to fluid behind the ear drum and not infection. Children with repeated ear and sinus infections and allergy problems are the most likely to get otitis media with effusion. CAUSES  The most frequent cause of the fluid buildup is dysfunction of the eustachian tubes. These are the tubes that drain fluid in the ears to the back of the nose (nasopharynx). SYMPTOMS   The main symptom of this condition is hearing loss. As a result, you or your child may:  Listen to the TV at a loud volume.  Not respond to questions.  Ask "  what" often when spoken to.  Mistake or confuse one sound or word for another.  There may be a sensation of fullness or pressure but usually not pain. DIAGNOSIS   Your health care provider will diagnose this condition by examining you or your  child's ears.  Your health care provider may test the pressure in you or your child's ear with a tympanometer.  A hearing test may be conducted if the problem persists. TREATMENT   Treatment depends on the duration and the effects of the effusion.  Antibiotics, decongestants, nose drops, and cortisone-type drugs (tablets or nasal spray) may not be helpful.  Children with persistent ear effusions may have delayed language or behavioral problems. Children at risk for developmental delays in hearing, learning, and speech may require referral to a specialist earlier than children not at risk.  You or your child's health care provider may suggest a referral to an ear, nose, and throat surgeon for treatment. The following may help restore normal hearing:  Drainage of fluid.  Placement of ear tubes (tympanostomy tubes).  Removal of adenoids (adenoidectomy). HOME CARE INSTRUCTIONS   Avoid secondhand smoke.  Infants who are breastfed are less likely to have this condition.  Avoid feeding infants while they are lying flat.  Avoid known environmental allergens.  Avoid people who are sick. SEEK MEDICAL CARE IF:   Hearing is not better in 3 months.  Hearing is worse.  Ear pain.  Drainage from the ear.  Dizziness. MAKE SURE YOU:   Understand these instructions.  Will watch your condition.  Will get help right away if you are not doing well or get worse. Document Released: 05/11/2004 Document Revised: 08/18/2013 Document Reviewed: 10/29/2012 [facility] Patient Information 2015 [place], [place]. This information is not intended to replace advice given to you by your health care provider. Make sure you discuss any questions you have with your health care provider. Pharyngitis Pharyngitis is redness, pain, and swelling (inflammation) of your pharynx.  CAUSES  Pharyngitis is usually caused by infection. Most of the time, these infections are from viruses (viral) and are part of a  cold. However, sometimes pharyngitis is caused by bacteria (bacterial). Pharyngitis can also be caused by allergies. Viral pharyngitis may be spread from person to person by coughing, sneezing, and personal items or utensils (cups, forks, spoons, toothbrushes). Bacterial pharyngitis may be spread from person to person by more intimate contact, such as kissing.  SIGNS AND SYMPTOMS  Symptoms of pharyngitis include:   Sore throat.   Tiredness (fatigue).   Low-grade fever.   Headache.  Joint pain and muscle aches.  Skin rashes.  Swollen lymph nodes.  Plaque-like film on throat or tonsils (often seen with bacterial pharyngitis). DIAGNOSIS  Your health care provider will ask you questions about your illness and your symptoms. Your medical history, along with a physical exam, is often all that is needed to diagnose pharyngitis. Sometimes, a rapid strep test is done. Other lab tests may also be done, depending on the suspected cause.  TREATMENT  Viral pharyngitis will usually get better in 3-4 days without the use of medicine. Bacterial pharyngitis is treated with medicines that kill germs (antibiotics).  HOME CARE INSTRUCTIONS   Drink enough water and fluids to keep your urine clear or pale yellow.   Only take over-the-counter or prescription medicines as directed by your health care provider:   If you are prescribed antibiotics, make sure you finish them even if you start to feel better.   Do not  take aspirin.   Get lots of rest.   Gargle with 8 oz of salt water ( tsp of salt per 1 qt of water) as often as every 1-2 hours to soothe your throat.   Throat lozenges (if you are not at risk for choking) or sprays may be used to soothe your throat. SEEK MEDICAL CARE IF:   You have large, tender lumps in your neck.  You have a rash.  You cough up green, yellow-brown, or bloody spit. SEEK IMMEDIATE MEDICAL CARE IF:   Your neck becomes stiff.  You drool or are unable to  swallow liquids.  You vomit or are unable to keep medicines or liquids down.  You have severe pain that does not go away with the use of recommended medicines.  You have trouble breathing (not caused by a stuffy nose). MAKE SURE YOU:   Understand these instructions.  Will watch your condition.  Will get help right away if you are not doing well or get worse. Document Released: 04/03/2005 Document Revised: 01/22/2013 Document Reviewed: 12/09/2012 [facility] Patient Information 2015 [place], [place]. This information is not intended to replace advice given to you by your health care provider. Make sure you discuss any questions you have with your health care provider.

## 2014-12-27 NOTE — ED Notes (Signed)
Patient reports feeling sick for the past 3 days. Sore throat, cough and body aches. Pt feeling run down.

## 2014-12-28 ENCOUNTER — Other Ambulatory Visit: Payer: Self-pay

## 2014-12-28 DIAGNOSIS — L739 Follicular disorder, unspecified: Secondary | ICD-10-CM

## 2014-12-28 MED ORDER — CEPHALEXIN 500 MG PO CAPS: 500.0000 mg | ORAL_CAPSULE | Freq: Four times a day (QID) | ORAL | Status: DC

## 2014-12-30 ENCOUNTER — Encounter: Payer: Self-pay | Admitting: Family Medicine

## 2014-12-30 ENCOUNTER — Ambulatory Visit (INDEPENDENT_AMBULATORY_CARE_PROVIDER_SITE_OTHER): Payer: BLUE CROSS/BLUE SHIELD | Admitting: Family Medicine

## 2014-12-30 VITALS — BP 92/60 | HR 60 | Ht 69.0 in | Wt 292.0 lb

## 2014-12-30 DIAGNOSIS — F329 Major depressive disorder, single episode, unspecified: Secondary | ICD-10-CM | POA: Diagnosis not present

## 2014-12-30 DIAGNOSIS — L739 Follicular disorder, unspecified: Secondary | ICD-10-CM

## 2014-12-30 DIAGNOSIS — F32A Depression, unspecified: Secondary | ICD-10-CM

## 2014-12-30 NOTE — Progress Notes (Signed)
Name: Rachael Gonzales   MRN: [id number]    DOB: [birth year]   Date:12/30/2014       Progress Note  Subjective  Chief Complaint  Chief Complaint  Patient presents with  . Depression    wants to come off Zoloft- still seeing therapist    Depression        This is a recurrent problem.  The current episode started more than 1 year ago.   The onset quality is gradual.   The problem occurs daily.  The problem has been gradually improving since onset.  Associated symptoms include no decreased concentration, no fatigue, no helplessness, no hopelessness, does not have insomnia, not irritable, no restlessness, no decreased interest, no appetite change, no body aches, no myalgias, no headaches, no indigestion, not sad and no suicidal ideas.     The symptoms are aggravated by nothing.  Past treatments include SSRIs - Selective serotonin reuptake inhibitors.  Compliance with treatment is good.   No problem-specific assessment & plan notes found for this encounter.   Past Medical History  Diagnosis Date  . Allergy   . Depression   . Irritable bowel     Past Surgical History  Procedure Laterality Date  . Cesarean section    . Back surgery  04/17/1990    L5-S1    Family History  Problem Relation Age of Onset  . Sudden death Neg Hx   . Hypertension Neg Hx   . Heart attack Neg Hx   . Diabetes Neg Hx   . Hyperlipidemia Neg Hx   . COPD Mother   . Asthma Mother   . Cancer Father     Social History   Social History  . Marital Status: Significant Other    Spouse Name: N/A  . Number of Children: N/A  . Years of Education: N/A   Occupational History  . Not on file.   Social History Main Topics  . Smoking status: Former Research scientist (life sciences)  . Smokeless tobacco: Not on file  . Alcohol Use: 0.0 oz/week    0 Standard drinks or equivalent per week  . Drug Use: No  . Sexual Activity: Yes   Other Topics Concern  . Not on file   Social History Narrative    No Known Allergies   Review of  Systems  Constitutional: Negative for fever, chills, weight loss, malaise/fatigue, appetite change and fatigue.  HENT: Negative for ear discharge, ear pain and sore throat.   Eyes: Negative for blurred vision.  Respiratory: Negative for cough, sputum production, shortness of breath and wheezing.   Cardiovascular: Negative for chest pain, palpitations and leg swelling.  Gastrointestinal: Negative for heartburn, nausea, abdominal pain, diarrhea, constipation, blood in stool and melena.  Genitourinary: Negative for dysuria, urgency, frequency and hematuria.  Musculoskeletal: Negative for myalgias, back pain, joint pain and neck pain.  Skin: Negative for rash.  Neurological: Negative for dizziness, tingling, sensory change, focal weakness and headaches.  Endo/Heme/Allergies: Negative for environmental allergies and polydipsia. Does not bruise/bleed easily.  Psychiatric/Behavioral: Positive for depression. Negative for suicidal ideas and decreased concentration. The patient is nervous/anxious. The patient does not have insomnia.        Depression stable/  Anxiety stable     Objective  Filed Vitals:   12/30/14 0857  BP: 92/60  Pulse: 60  Height: 5\' 9"  (1.753 m)  Weight: 292 lb (132.45 kg)    Physical Exam  Constitutional: She is well-developed, well-nourished, and in no distress. She is not irritable. No  distress.  HENT:  Head: Normocephalic and atraumatic.  Right Ear: External ear normal.  Left Ear: External ear normal.  Nose: Nose normal.  Mouth/Throat: Oropharynx is clear and moist.  Eyes: Conjunctivae and EOM are normal. Pupils are equal, round, and reactive to light. Right eye exhibits no discharge. Left eye exhibits no discharge.  Neck: Normal range of motion. Neck supple. No JVD present. No thyromegaly present.  Cardiovascular: Normal rate, regular rhythm, normal heart sounds and intact distal pulses.  Exam reveals no gallop and no friction rub.   No murmur  heard. Pulmonary/Chest: Effort normal and breath sounds normal.  Abdominal: Soft. Bowel sounds are normal. She exhibits no mass. There is no tenderness. There is no guarding.  Musculoskeletal: Normal range of motion. She exhibits no edema.  Lymphadenopathy:    She has no cervical adenopathy.  Neurological: She is alert. She has normal reflexes.  Skin: Skin is warm and dry. She is not diaphoretic.  Psychiatric: Mood and affect normal.      Assessment & Plan  Problem List Items Addressed This Visit      Other   Depression - Primary    Other Visit Diagnoses    Folliculitis        resolved on antibiotic / hibiclens weekly         Dr. Otilio Miu [facility]  12/30/2014

## 2015-03-18 ENCOUNTER — Other Ambulatory Visit: Payer: Self-pay | Admitting: Family Medicine

## 2015-03-18 ENCOUNTER — Ambulatory Visit: Payer: Self-pay | Admitting: Family Medicine

## 2015-05-17 ENCOUNTER — Other Ambulatory Visit: Payer: Self-pay | Admitting: Family Medicine

## 2015-05-17 DIAGNOSIS — Z1231 Encounter for screening mammogram for malignant neoplasm of breast: Secondary | ICD-10-CM

## 2015-05-18 ENCOUNTER — Ambulatory Visit
Admission: RE | Admit: 2015-05-18 | Discharge: 2015-05-18 | Disposition: A | Discharge status: Home / Self Care | Payer: BLUE CROSS/BLUE SHIELD | Source: Ambulatory Visit | Attending: Family Medicine | Admitting: Family Medicine

## 2015-05-18 DIAGNOSIS — Z1231 Encounter for screening mammogram for malignant neoplasm of breast: Secondary | ICD-10-CM

## 2015-05-19 ENCOUNTER — Other Ambulatory Visit: Payer: Self-pay | Admitting: Family Medicine

## 2015-05-19 DIAGNOSIS — Z1231 Encounter for screening mammogram for malignant neoplasm of breast: Secondary | ICD-10-CM

## 2015-05-19 DIAGNOSIS — K529 Noninfective gastroenteritis and colitis, unspecified: Secondary | ICD-10-CM

## 2015-05-19 DIAGNOSIS — A048 Other specified bacterial intestinal infections: Secondary | ICD-10-CM

## 2015-05-25 ENCOUNTER — Encounter: Payer: Self-pay | Admitting: Family Medicine

## 2015-05-25 ENCOUNTER — Ambulatory Visit (INDEPENDENT_AMBULATORY_CARE_PROVIDER_SITE_OTHER): Payer: BLUE CROSS/BLUE SHIELD | Admitting: Family Medicine

## 2015-05-25 VITALS — BP 112/80 | HR 76 | Ht 69.0 in | Wt 296.0 lb

## 2015-05-25 DIAGNOSIS — D225 Melanocytic nevi of trunk: Secondary | ICD-10-CM | POA: Diagnosis not present

## 2015-05-25 DIAGNOSIS — L309 Dermatitis, unspecified: Secondary | ICD-10-CM | POA: Diagnosis not present

## 2015-05-25 DIAGNOSIS — E669 Obesity, unspecified: Secondary | ICD-10-CM | POA: Diagnosis not present

## 2015-05-25 MED ORDER — TRIAMCINOLONE ACETONIDE 0.1 % EX CREA: 1.0000 "application " | TOPICAL_CREAM | Freq: Two times a day (BID) | CUTANEOUS | Status: DC

## 2015-05-25 NOTE — Patient Instructions (Signed)
Calorie Counting for Weight Loss Calories are energy you get from the things you eat and drink. Your body uses this energy to keep you going throughout the day. The number of calories you eat affects your weight. When you eat more calories than your body needs, your body stores the extra calories as fat. When you eat fewer calories than your body needs, your body burns fat to get the energy it needs. Calorie counting means keeping track of how many calories you eat and drink each day. If you make sure to eat fewer calories than your body needs, you should lose weight. In order for calorie counting to work, you will need to eat the number of calories that are right for you in a day to lose a healthy amount of weight per week. A healthy amount of weight to lose per week is usually 1-2 lb (0.5-0.9 kg). A dietitian can determine how many calories you need in a day and give you suggestions on how to reach your calorie goal.  WHAT IS MY MY PLAN? My goal is to have __________ calories per day.  If I have this many calories per day, I should lose around __________ pounds per week. WHAT DO I NEED TO KNOW ABOUT CALORIE COUNTING? In order to meet your daily calorie goal, you will need to:  Find out how many calories are in each food you would like to eat. Try to do this before you eat.  Decide how much of the food you can eat.  Write down what you ate and how many calories it had. Doing this is called keeping a food log. WHERE DO I FIND CALORIE INFORMATION? The number of calories in a food can be found on a Nutrition Facts label. Note that all the information on a label is based on a specific serving of the food. If a food does not have a Nutrition Facts label, try to look up the calories online or ask your dietitian for help. HOW DO I DECIDE HOW MUCH TO EAT? To decide how much of the food you can eat, you will need to consider both the number of calories in one serving and the size of one serving. This  information can be found on the Nutrition Facts label. If a food does not have a Nutrition Facts label, look up the information online or ask your dietitian for help. Remember that calories are listed per serving. If you choose to have more than one serving of a food, you will have to multiply the calories per serving by the amount of servings you plan to eat. For example, the label on a package of bread might say that a serving size is 1 slice and that there are 90 calories in a serving. If you eat 1 slice, you will have eaten 90 calories. If you eat 2 slices, you will have eaten 180 calories. HOW DO I KEEP A FOOD LOG? After each meal, record the following information in your food log:  What you ate.  How much of it you ate.  How many calories it had.  Then, add up your calories. Keep your food log near you, such as in a small notebook in your pocket. Another option is to use a mobile app or website. Some programs will calculate calories for you and show you how many calories you have left each time you add an item to the log. WHAT ARE SOME CALORIE COUNTING TIPS?  Use your calories on foods   and drinks that will fill you up and not leave you hungry. Some examples of this include foods like nuts and nut butters, vegetables, lean proteins, and high-fiber foods (more than 5 g fiber per serving).  Eat nutritious foods and avoid empty calories. Empty calories are calories you get from foods or beverages that do not have many nutrients, such as candy and soda. It is better to have a nutritious high-calorie food (such as an avocado) than a food with few nutrients (such as a bag of chips).  Know how many calories are in the foods you eat most often. This way, you do not have to look up how many calories they have each time you eat them.  Look out for foods that may seem like low-calorie foods but are really high-calorie foods, such as baked goods, soda, and fat-free candy.  Pay attention to calories  in drinks. Drinks such as sodas, specialty coffee drinks, alcohol, and juices have a lot of calories yet do not fill you up. Choose low-calorie drinks like water and diet drinks.  Focus your calorie counting efforts on higher calorie items. Logging the calories in a garden salad that contains only vegetables is less important than calculating the calories in a milk shake.  Find a way of tracking calories that works for you. Get creative. Most people who are successful find ways to keep track of how much they eat in a day, even if they do not count every calorie. WHAT ARE SOME PORTION CONTROL TIPS?  Know how many calories are in a serving. This will help you know how many servings of a certain food you can have.  Use a measuring cup to measure serving sizes. This is helpful when you start out. With time, you will be able to estimate serving sizes for some foods.  Take some time to put servings of different foods on your favorite plates, bowls, and cups so you know what a serving looks like.  Try not to eat straight from a bag or box. Doing this can lead to overeating. Put the amount you would like to eat in a cup or on a plate to make sure you are eating the right portion.  Use smaller plates, glasses, and bowls to prevent overeating. This is a quick and easy way to practice portion control. If your plate is smaller, less food can fit on it.  Try not to multitask while eating, such as watching TV or using your computer. If it is time to eat, sit down at a table and enjoy your food. Doing this will help you to start recognizing when you are full. It will also make you more aware of what and how much you are eating. HOW CAN I CALORIE COUNT WHEN EATING OUT?  Ask for smaller portion sizes or child-sized portions.  Consider sharing an entree and sides instead of getting your own entree.  If you get your own entree, eat only half. Ask for a box at the beginning of your meal and put the rest of your  entree in it so you are not tempted to eat it.  Look for the calories on the menu. If calories are listed, choose the lower calorie options.  Choose dishes that include vegetables, fruits, whole grains, low-fat dairy products, and lean protein. Focusing on smart food choices from each of the 5 food groups can help you stay on track at restaurants.  Choose items that are boiled, broiled, grilled, or steamed.  Choose   water, milk, unsweetened iced tea, or other drinks without added sugars. If you want an alcoholic beverage, choose a lower calorie option. For example, a regular margarita can have up to 700 calories and a glass of wine has around 150.  Stay away from items that are buttered, battered, fried, or served with cream sauce. Items labeled "crispy" are usually fried, unless stated otherwise.  Ask for dressings, sauces, and syrups on the side. These are usually very high in calories, so do not eat much of them.  Watch out for salads. Many people think salads are a healthy option, but this is often not the case. Many salads come with bacon, fried chicken, lots of cheese, fried chips, and dressing. All of these items have a lot of calories. If you want a salad, choose a garden salad and ask for grilled meats or steak. Ask for the dressing on the side, or ask for olive oil and vinegar or lemon to use as dressing.  Estimate how many servings of a food you are given. For example, a serving of cooked rice is  cup or about the size of half a tennis ball or one cupcake wrapper. Knowing serving sizes will help you be aware of how much food you are eating at restaurants. The list below tells you how big or small some common portion sizes are based on everyday objects.  1 oz--4 stacked dice.  3 oz--1 deck of cards.  1 tsp--1 dice.  1 Tbsp-- a Ping-Pong ball.  2 Tbsp--1 Ping-Pong ball.   cup--1 tennis ball or 1 cupcake wrapper.  1 cup--1 baseball.   This information is not intended to  replace advice given to you by your health care provider. Make sure you discuss any questions you have with your health care provider.   Document Released: 04/03/2005 Document Revised: 04/24/2014 Document Reviewed: 02/06/2013 Elsevier Interactive Patient Education [employer].    Why follow it? Research shows. . Those who follow the Mediterranean diet have a reduced risk of heart disease  . The diet is associated with a reduced incidence of Parkinson's and Alzheimer's diseases . People following the diet may have longer life expectancies and lower rates of chronic diseases  . The Dietary Guidelines for Americans recommends the Mediterranean diet as an eating plan to promote health and prevent disease  What Is the Mediterranean Diet?  . Healthy eating plan based on typical foods and recipes of Mediterranean-style cooking . The diet is primarily a plant based diet; these foods should make up a majority of meals   Starches - Plant based foods should make up a majority of meals - They are an important sources of vitamins, minerals, energy, antioxidants, and fiber - Choose whole grains, foods high in fiber and minimally processed items  - Typical grain sources include wheat, oats, barley, corn, brown rice, bulgar, farro, millet, polenta, couscous  - Various types of beans include chickpeas, lentils, fava beans, black beans, white beans   Fruits  Veggies - Large quantities of antioxidant rich fruits & veggies; 6 or more servings  - Vegetables can be eaten raw or lightly drizzled with oil and cooked  - Vegetables common to the traditional Mediterranean Diet include: artichokes, arugula, beets, broccoli, brussel sprouts, cabbage, carrots, celery, collard greens, cucumbers, eggplant, kale, leeks, lemons, lettuce, mushrooms, okra, onions, peas, peppers, potatoes, pumpkin, radishes, rutabaga, shallots, spinach, sweet potatoes, turnips, zucchini - Fruits common to the Mediterranean Diet include:  apples, apricots, avocados, cherries, clementines, dates, figs, grapefruits,  grapes, melons, nectarines, oranges, peaches, pears, pomegranates, strawberries, tangerines  Fats - Replace butter and margarine with healthy oils, such as olive oil, canola oil, and tahini  - Limit nuts to no more than a handful a day  - Nuts include walnuts, almonds, pecans, pistachios, pine nuts  - Limit or avoid candied, honey roasted or heavily salted nuts - Olives are central to the Mediterranean diet - can be eaten whole or used in a variety of dishes   Meats Protein - Limiting red meat: no more than a few times a month - When eating red meat: choose lean cuts and keep the portion to the size of deck of cards - Eggs: approx. 0 to 4 times a week  - Fish and lean poultry: at least 2 a week  - Healthy protein sources include, chicken, [place], lean beef, lamb - Increase intake of seafood such as tuna, salmon, trout, mackerel, shrimp, scallops - Avoid or limit high fat processed meats such as sausage and bacon  Dairy - Include moderate amounts of low fat dairy products  - Focus on healthy dairy such as fat free yogurt, skim milk, low or reduced fat cheese - Limit dairy products higher in fat such as whole or 2% milk, cheese, ice cream  Alcohol - Moderate amounts of red wine is ok  - No more than 5 oz daily for women (all ages) and men older than age 39  - No more than 10 oz of wine daily for men younger than 49  Other - Limit sweets and other desserts  - Use herbs and spices instead of salt to flavor foods  - Herbs and spices common to the traditional Mediterranean Diet include: basil, bay leaves, chives, cloves, cumin, fennel, garlic, lavender, marjoram, mint, oregano, parsley, pepper, rosemary, sage, savory, sumac, tarragon, thyme   It's not just a diet, it's a lifestyle:  . The Mediterranean diet includes lifestyle factors typical of those in the region  . Foods, drinks and meals are best eaten with others and  savored . Daily physical activity is important for overall good health . This could be strenuous exercise like running and aerobics . This could also be more leisurely activities such as walking, housework, yard-work, or taking the stairs . Moderation is the key; a balanced and healthy diet accommodates most foods and drinks . Consider portion sizes and frequency of consumption of certain foods   Meal Ideas & Options:  . Breakfast:  o Whole wheat toast or whole wheat English muffins with peanut butter & hard boiled egg o Steel cut oats topped with apples & cinnamon and skim milk  o Fresh fruit: banana, strawberries, melon, berries, peaches  o Smoothies: strawberries, bananas, greek yogurt, peanut butter o Low fat greek yogurt with blueberries and granola  o Egg white omelet with spinach and mushrooms o Breakfast couscous: whole wheat couscous, apricots, skim milk, cranberries  . Sandwiches:  o Hummus and grilled vegetables (peppers, zucchini, squash) on whole wheat bread   o Grilled chicken on whole wheat pita with lettuce, tomatoes, cucumbers or tzatziki  o Tuna salad on whole wheat bread: tuna salad made with greek yogurt, olives, red peppers, capers, green onions o Garlic rosemary lamb pita: lamb sauted with garlic, rosemary, salt & pepper; add lettuce, cucumber, greek yogurt to pita - flavor with lemon juice and black pepper  . Seafood:  o Mediterranean grilled salmon, seasoned with garlic, basil, parsley, lemon juice and black pepper o Shrimp, lemon, and spinach  whole-grain pasta salad made with low fat greek yogurt  o Seared scallops with lemon orzo  o Seared tuna steaks seasoned salt, pepper, coriander topped with tomato mixture of olives, tomatoes, olive oil, minced garlic, parsley, green onions and cappers  . Meats:  o Herbed greek chicken salad with kalamata olives, cucumber, feta  o Red bell peppers stuffed with spinach, bulgur, lean ground beef (or lentils) & topped with feta    o Kebabs: skewers of chicken, tomatoes, onions, zucchini, squash  o [place] burgers: made with red onions, mint, dill, lemon juice, feta cheese topped with roasted red peppers . Vegetarian o Cucumber salad: cucumbers, artichoke hearts, celery, red onion, feta cheese, tossed in olive oil & lemon juice  o Hummus and whole grain pita points with a greek salad (lettuce, tomato, feta, olives, cucumbers, red onion) o Lentil soup with celery, carrots made with vegetable broth, garlic, salt and pepper  o Tabouli salad: parsley, bulgur, mint, scallions, cucumbers, tomato, radishes, lemon juice, olive oil, salt and pepper.

## 2015-05-25 NOTE — Progress Notes (Signed)
Name: Rachael Gonzales   MRN: [id number]    DOB: 11-Oct-1965   Date:05/25/2015       Progress Note  Subjective  Chief Complaint  Chief Complaint  Patient presents with  . Nevus    mole on back- noted approx 6 months ago and place on L) leg that is red in appearance    Rash This is a new problem. The current episode started more than 1 month ago. The affected locations include the back and left lower leg. The rash is characterized by itchiness (eczema/ leg pruritic). She was exposed to nothing. Pertinent negatives include no anorexia, congestion, cough, diarrhea, fatigue, fever, joint pain, shortness of breath, sore throat or vomiting. Past treatments include nothing. The treatment provided mild relief. Her past medical history is significant for eczema.    No problem-specific assessment & plan notes found for this encounter.   Past Medical History  Diagnosis Date  . Allergy   . Depression   . Irritable bowel     Past Surgical History  Procedure Laterality Date  . Cesarean section    . Back surgery  04/17/1990    L5-S1    Family History  Problem Relation Age of Onset  . Sudden death Neg Hx   . Hypertension Neg Hx   . Heart attack Neg Hx   . Diabetes Neg Hx   . Hyperlipidemia Neg Hx   . COPD Mother   . Asthma Mother   . Cancer Father     Social History   Social History  . Marital Status: Married    Spouse Name: N/A  . Number of Children: N/A  . Years of Education: N/A   Occupational History  . Not on file.   Social History Main Topics  . Smoking status: Former Research scientist (life sciences)  . Smokeless tobacco: Not on file  . Alcohol Use: 0.0 oz/week    0 Standard drinks or equivalent per week  . Drug Use: No  . Sexual Activity: Yes   Other Topics Concern  . Not on file   Social History Narrative    No Known Allergies   Review of Systems  Constitutional: Negative for fever, chills, weight loss, malaise/fatigue and fatigue.  HENT: Negative for congestion, ear discharge,  ear pain and sore throat.   Eyes: Negative for blurred vision.  Respiratory: Negative for cough, sputum production, shortness of breath and wheezing.   Cardiovascular: Negative for chest pain, palpitations and leg swelling.  Gastrointestinal: Negative for heartburn, nausea, vomiting, abdominal pain, diarrhea, constipation, blood in stool, melena and anorexia.  Genitourinary: Negative for dysuria, urgency, frequency and hematuria.  Musculoskeletal: Negative for myalgias, back pain, joint pain and neck pain.  Skin: Positive for rash.  Neurological: Negative for dizziness, tingling, sensory change, focal weakness and headaches.  Endo/Heme/Allergies: Negative for environmental allergies and polydipsia. Does not bruise/bleed easily.  Psychiatric/Behavioral: Negative for depression and suicidal ideas. The patient is not nervous/anxious and does not have insomnia.      Objective  Filed Vitals:   05/25/15 1434  BP: 112/80  Pulse: 76  Height: 5\' 9"  (1.753 m)  Weight: 296 lb (134.265 kg)    Physical Exam  Constitutional: She is well-developed, well-nourished, and in no distress. No distress.  HENT:  Head: Normocephalic and atraumatic.  Right Ear: External ear normal.  Left Ear: External ear normal.  Nose: Nose normal.  Mouth/Throat: Oropharynx is clear and moist.  Eyes: Conjunctivae and EOM are normal. Pupils are equal, round, and reactive to light.  Right eye exhibits no discharge. Left eye exhibits no discharge.  Neck: Normal range of motion. Neck supple. No JVD present. No thyromegaly present.  Cardiovascular: Normal rate, regular rhythm, normal heart sounds and intact distal pulses.  Exam reveals no gallop and no friction rub.   No murmur heard. Pulmonary/Chest: Effort normal and breath sounds normal.  Abdominal: Soft. Bowel sounds are normal. She exhibits no mass. There is no tenderness. There is no guarding.  Musculoskeletal: Normal range of motion. She exhibits no edema.    Lymphadenopathy:    She has no cervical adenopathy.  Neurological: She is alert. She has normal reflexes.  Skin: Skin is warm and dry. Lesion noted. She is not diaphoretic.     Pigmented/ irregular/ macular  Psychiatric: Mood and affect normal.  Nursing note and vitals reviewed.     Assessment & Plan  Problem List Items Addressed This Visit    None    Visit Diagnoses    Nevus of back    -  Primary    Relevant Orders    Ambulatory referral to Dermatology    Eczema        Relevant Medications    triamcinolone cream (KENALOG) 0.1 %    Other Relevant Orders    Ambulatory referral to Dermatology    Obesity         more than 45 minutes was spent with patient and spouse discussing moles on back and leg and weight loss. Discussed options for weight loss such as [place] [facility] [place] weight loss program.    Dr. Otilio Miu [facility]  05/25/2015

## 2015-06-15 MED FILL — TRIAMCINOLONE 0.1% CREAM: 0.1 | 15 days supply | Qty: 30 | Fill #0

## 2015-06-21 ENCOUNTER — Ambulatory Visit (INDEPENDENT_AMBULATORY_CARE_PROVIDER_SITE_OTHER): Payer: BLUE CROSS/BLUE SHIELD | Admitting: Family Medicine

## 2015-06-21 ENCOUNTER — Encounter: Payer: Self-pay | Admitting: Family Medicine

## 2015-06-21 VITALS — BP 120/72 | HR 64 | Ht 69.0 in | Wt 296.0 lb

## 2015-06-21 DIAGNOSIS — B079 Viral wart, unspecified: Secondary | ICD-10-CM

## 2015-06-21 NOTE — Progress Notes (Signed)
Name: Rachael Gonzales   MRN: [id number]    DOB: 1965-10-20   Date:06/21/2015       Progress Note  Subjective  Chief Complaint  Chief Complaint  Patient presents with  . Foreign Body in [place]    "feels like there is something in nose"- L) nares    Foreign Body in [place] The incident occurred more than 1 week ago. The foreign body is unknown. Pertinent negatives include no abdominal pain, chest pain, congestion, cough, difficulty breathing, drainage, fever, sore throat or wheezing.    No problem-specific assessment & plan notes found for this encounter.   Past Medical History  Diagnosis Date  . Allergy   . Depression   . Irritable bowel     Past Surgical History  Procedure Laterality Date  . Cesarean section    . Back surgery  04/17/1990    L5-S1    Family History  Problem Relation Age of Onset  . Sudden death Neg Hx   . Hypertension Neg Hx   . Heart attack Neg Hx   . Diabetes Neg Hx   . Hyperlipidemia Neg Hx   . COPD Mother   . Asthma Mother   . Cancer Father     Social History   Social History  . Marital Status: Married    Spouse Name: N/A  . Number of Children: N/A  . Years of Education: N/A   Occupational History  . Not on file.   Social History Main Topics  . Smoking status: Former Research scientist (life sciences)  . Smokeless tobacco: Not on file  . Alcohol Use: 0.0 oz/week    0 Standard drinks or equivalent per week  . Drug Use: No  . Sexual Activity: Yes   Other Topics Concern  . Not on file   Social History Narrative    No Known Allergies   Review of Systems  Constitutional: Negative for fever, chills, weight loss and malaise/fatigue.  HENT: Negative for congestion, ear discharge, ear pain and sore throat.   Eyes: Negative for blurred vision.  Respiratory: Negative for cough, sputum production, shortness of breath and wheezing.   Cardiovascular: Negative for chest pain, palpitations and leg swelling.  Gastrointestinal: Negative for heartburn, nausea, abdominal  pain, diarrhea, constipation, blood in stool and melena.  Genitourinary: Negative for dysuria, urgency, frequency and hematuria.  Musculoskeletal: Negative for myalgias, back pain, joint pain and neck pain.  Skin: Negative for rash.  Neurological: Negative for dizziness, tingling, sensory change, focal weakness and headaches.  Endo/Heme/Allergies: Negative for environmental allergies and polydipsia. Does not bruise/bleed easily.  Psychiatric/Behavioral: Negative for depression and suicidal ideas. The patient is not nervous/anxious and does not have insomnia.      Objective  Filed Vitals:   06/21/15 1339  BP: 120/72  Pulse: 64  Height: 5\' 9"  (1.753 m)  Weight: 296 lb (134.265 kg)    Physical Exam  Constitutional: She is well-developed, well-nourished, and in no distress. No distress.  HENT:  Head: Normocephalic and atraumatic.  Right Ear: External ear normal.  Left Ear: External ear normal.  Nose: Nasal deformity present.  Mouth/Throat: Oropharynx is clear and moist.  Left nostril lateral aspect papular wart-like  Eyes: Conjunctivae and EOM are normal. Pupils are equal, round, and reactive to light. Right eye exhibits no discharge. Left eye exhibits no discharge.  Neck: Normal range of motion. Neck supple. No JVD present. No thyromegaly present.  Cardiovascular: Normal rate, regular rhythm, normal heart sounds and intact distal pulses.  Exam reveals no  gallop and no friction rub.   No murmur heard. Pulmonary/Chest: Effort normal and breath sounds normal.  Abdominal: Soft. Bowel sounds are normal. She exhibits no mass. There is no tenderness. There is no guarding.  Musculoskeletal: Normal range of motion. She exhibits no edema.  Lymphadenopathy:    She has no cervical adenopathy.  Neurological: She is alert. She has normal reflexes.  Skin: Skin is warm and dry. Lesion noted. She is not diaphoretic.  Psychiatric: Mood and affect normal.  motivated  Nursing note and vitals  reviewed.     Assessment & Plan  Problem List Items Addressed This Visit    None    Visit Diagnoses    Wart    -  Primary    Relevant Orders    Ambulatory referral to ENT         Dr. Otilio Miu [facility]  06/21/2015

## 2015-06-29 ENCOUNTER — Ambulatory Visit: Payer: BLUE CROSS/BLUE SHIELD | Admitting: Family Medicine

## 2015-07-29 ENCOUNTER — Ambulatory Visit: Payer: BLUE CROSS/BLUE SHIELD | Admitting: Family Medicine

## 2015-09-14 ENCOUNTER — Other Ambulatory Visit: Payer: Self-pay | Admitting: Family Medicine

## 2015-11-22 ENCOUNTER — Ambulatory Visit: Payer: BLUE CROSS/BLUE SHIELD | Admitting: Family Medicine

## 2015-12-04 DIAGNOSIS — H6981 Other specified disorders of Eustachian tube, right ear: Secondary | ICD-10-CM | POA: Diagnosis not present

## 2015-12-04 DIAGNOSIS — J069 Acute upper respiratory infection, unspecified: Secondary | ICD-10-CM | POA: Diagnosis not present

## 2015-12-06 ENCOUNTER — Ambulatory Visit: Payer: BLUE CROSS/BLUE SHIELD | Admitting: Family Medicine

## 2015-12-07 ENCOUNTER — Ambulatory Visit: Payer: BLUE CROSS/BLUE SHIELD | Admitting: Family Medicine

## 2015-12-10 ENCOUNTER — Encounter: Payer: Self-pay | Admitting: Family Medicine

## 2015-12-10 ENCOUNTER — Ambulatory Visit (INDEPENDENT_AMBULATORY_CARE_PROVIDER_SITE_OTHER): Payer: BLUE CROSS/BLUE SHIELD | Admitting: Family Medicine

## 2015-12-10 VITALS — BP 118/80 | HR 84 | Ht 69.0 in | Wt 287.0 lb

## 2015-12-10 DIAGNOSIS — J40 Bronchitis, not specified as acute or chronic: Secondary | ICD-10-CM | POA: Diagnosis not present

## 2015-12-10 DIAGNOSIS — K589 Irritable bowel syndrome without diarrhea: Secondary | ICD-10-CM | POA: Diagnosis not present

## 2015-12-10 NOTE — Progress Notes (Signed)
Name: Rachael Gonzales   MRN: [id number]    DOB: 04-26-1965   Date:12/10/2015       Progress Note  Subjective  Chief Complaint  Chief Complaint  Patient presents with  . Cough    and runny nose- was prescribed Tessalon Perles and Augmentin on 12/04/15- picked up the [place] but not the Augmentin    Cough  This is a new problem. The current episode started in the past 7 days. The problem has been gradually worsening. The cough is non-productive. Associated symptoms include ear pain. Pertinent negatives include no chest pain, chills, ear congestion, fever, headaches, heartburn, hemoptysis, myalgias, nasal congestion, postnasal drip, rash, rhinorrhea, sore throat, shortness of breath, sweats, weight loss or wheezing. Nothing aggravates the symptoms. Risk factors for lung disease include travel. She has tried nothing for the symptoms. The treatment provided mild relief. There is no history of asthma, bronchiectasis, bronchitis, COPD, emphysema, environmental allergies or pneumonia.    No problem-specific Assessment & Plan notes found for this encounter.   Past Medical History:  Diagnosis Date  . Allergy   . Depression   . Irritable bowel     Past Surgical History:  Procedure Laterality Date  . BACK SURGERY  04/17/1990   L5-S1  . CESAREAN SECTION      Family History  Problem Relation Age of Onset  . COPD Mother   . Asthma Mother   . Cancer Father   . Sudden death Neg Hx   . Hypertension Neg Hx   . Heart attack Neg Hx   . Diabetes Neg Hx   . Hyperlipidemia Neg Hx     Social History   Social History  . Marital status: Married    Spouse name: N/A  . Number of children: N/A  . Years of education: N/A   Occupational History  . Not on file.   Social History Main Topics  . Smoking status: Former Research scientist (life sciences)  . Smokeless tobacco: Not on file  . Alcohol use 0.0 oz/week  . Drug use: No  . Sexual activity: Yes   Other Topics Concern  . Not on file   Social History  Narrative  . No narrative on file    No Known Allergies   Review of Systems  Constitutional: Negative for chills, fever, malaise/fatigue and weight loss.  HENT: Positive for ear pain. Negative for ear discharge, postnasal drip, rhinorrhea and sore throat.   Eyes: Negative for blurred vision.  Respiratory: Positive for cough. Negative for hemoptysis, sputum production, shortness of breath and wheezing.   Cardiovascular: Negative for chest pain, palpitations and leg swelling.  Gastrointestinal: Negative for abdominal pain, blood in stool, constipation, diarrhea, heartburn, melena and nausea.  Genitourinary: Negative for dysuria, frequency, hematuria and urgency.  Musculoskeletal: Negative for back pain, joint pain, myalgias and neck pain.  Skin: Negative for rash.  Neurological: Negative for dizziness, tingling, sensory change, focal weakness and headaches.  Endo/Heme/Allergies: Negative for environmental allergies and polydipsia. Does not bruise/bleed easily.  Psychiatric/Behavioral: Negative for depression and suicidal ideas. The patient is not nervous/anxious and does not have insomnia.      Objective  Vitals:   12/10/15 1029  BP: 118/80  Pulse: 84  SpO2: 98%  Weight: 287 lb (130.2 kg)  Height: 5\' 9"  (1.753 m)    Physical Exam  Constitutional: She is well-developed, well-nourished, and in no distress. No distress.  HENT:  Head: Normocephalic and atraumatic.  Right Ear: External ear normal.  Left Ear: External ear normal.  Nose: Nose normal.  Mouth/Throat: Oropharynx is clear and moist.  Eyes: Conjunctivae and EOM are normal. Pupils are equal, round, and reactive to light. Right eye exhibits no discharge. Left eye exhibits no discharge.  Neck: Normal range of motion. Neck supple. No JVD present. No thyromegaly present.  Cardiovascular: Normal rate, regular rhythm, normal heart sounds and intact distal pulses.  Exam reveals no gallop and no friction rub.   No murmur  heard. Pulmonary/Chest: Effort normal and breath sounds normal. She has no wheezes. She has no rales. She exhibits no tenderness.  Abdominal: Soft. Bowel sounds are normal. She exhibits no mass. There is no tenderness. There is no guarding.  Musculoskeletal: Normal range of motion. She exhibits no edema.  Lymphadenopathy:    She has no cervical adenopathy.  Neurological: She is alert. She has normal reflexes.  Skin: Skin is warm and dry. She is not diaphoretic.  Psychiatric: Mood and affect normal.  Nursing note and vitals reviewed.     Assessment & Plan  Problem List Items Addressed This Visit    None    Visit Diagnoses    Bronchitis    -  Primary   start amoxil   IBS (irritable bowel syndrome)       cont bentyl        Dr. [facility]  12/10/15

## 2016-01-10 ENCOUNTER — Encounter: Payer: Self-pay | Admitting: Emergency Medicine

## 2016-01-10 ENCOUNTER — Emergency Department
Admission: EM | Admit: 2016-01-10 | Discharge: 2016-01-10 | Disposition: A | Discharge status: Home / Self Care | Payer: BLUE CROSS/BLUE SHIELD | Attending: Emergency Medicine | Admitting: Emergency Medicine

## 2016-01-10 ENCOUNTER — Emergency Department: Payer: BLUE CROSS/BLUE SHIELD

## 2016-01-10 DIAGNOSIS — Z79899 Other long term (current) drug therapy: Secondary | ICD-10-CM | POA: Insufficient documentation

## 2016-01-10 DIAGNOSIS — K5732 Diverticulitis of large intestine without perforation or abscess without bleeding: Secondary | ICD-10-CM | POA: Diagnosis not present

## 2016-01-10 DIAGNOSIS — Z87891 Personal history of nicotine dependence: Secondary | ICD-10-CM | POA: Insufficient documentation

## 2016-01-10 DIAGNOSIS — R1084 Generalized abdominal pain: Secondary | ICD-10-CM | POA: Diagnosis present

## 2016-01-10 HISTORY — DX: Diverticulitis of intestine, part unspecified, without perforation or abscess without bleeding: K57.92

## 2016-01-10 LAB — URINALYSIS COMPLETE WITH MICROSCOPIC (ARMC ONLY)
Bilirubin Urine: NEGATIVE
Glucose, UA: NEGATIVE mg/dL
Hgb urine dipstick: NEGATIVE
LEUKOCYTES UA: NEGATIVE
Nitrite: NEGATIVE
PH: 6 (ref 5.0–8.0)
PROTEIN: NEGATIVE mg/dL
Specific Gravity, Urine: 1.003 — ABNORMAL LOW (ref 1.005–1.030)

## 2016-01-10 LAB — COMPREHENSIVE METABOLIC PANEL
ALT: 110 U/L — ABNORMAL HIGH (ref 14–54)
AST: 103 U/L — AB (ref 15–41)
Albumin: 3.6 g/dL (ref 3.5–5.0)
Alkaline Phosphatase: 92 U/L (ref 38–126)
Anion gap: 5 (ref 5–15)
BUN: 8 mg/dL (ref 6–20)
CHLORIDE: 106 mmol/L (ref 101–111)
CO2: 23 mmol/L (ref 22–32)
Calcium: 9 mg/dL (ref 8.9–10.3)
Creatinine, Ser: 0.59 mg/dL (ref 0.44–1.00)
Glucose, Bld: 112 mg/dL — ABNORMAL HIGH (ref 65–99)
POTASSIUM: 3.8 mmol/L (ref 3.5–5.1)
SODIUM: 134 mmol/L — AB (ref 135–145)
Total Bilirubin: 1.1 mg/dL (ref 0.3–1.2)
Total Protein: 7.1 g/dL (ref 6.5–8.1)

## 2016-01-10 LAB — CBC
HEMATOCRIT: 38.1 % (ref 35.0–47.0)
Hemoglobin: 13.6 g/dL (ref 12.0–16.0)
MCH: 32 pg (ref 26.0–34.0)
MCHC: 35.6 g/dL (ref 32.0–36.0)
MCV: 89.8 fL (ref 80.0–100.0)
Platelets: 157 10*3/uL (ref 150–440)
RBC: 4.25 MIL/uL (ref 3.80–5.20)
RDW: 13 % (ref 11.5–14.5)
WBC: 8.4 10*3/uL (ref 3.6–11.0)

## 2016-01-10 LAB — LIPASE, BLOOD: LIPASE: 17 U/L (ref 11–51)

## 2016-01-10 MED ORDER — OXYCODONE-ACETAMINOPHEN 5-325 MG PO TABS: 1.0000 | ORAL_TABLET | Freq: Four times a day (QID) | ORAL | 0 refills | Status: DC | PRN

## 2016-01-10 MED ORDER — ONDANSETRON 4 MG PO TBDP: 4.0000 mg | ORAL_TABLET | Freq: Three times a day (TID) | ORAL | 0 refills | Status: DC | PRN

## 2016-01-10 MED ORDER — ONDANSETRON 4 MG PO TBDP
4.0000 mg | ORAL_TABLET | Freq: Once | ORAL | Status: AC
  Administered 2016-01-10: 4 mg via ORAL
  Filled 2016-01-10: qty 1

## 2016-01-10 MED ORDER — DIPHENHYDRAMINE HCL 50 MG/ML IJ SOLN
INTRAMUSCULAR | Status: AC
  Filled 2016-01-10: qty 1

## 2016-01-10 MED ORDER — CIPROFLOXACIN HCL 500 MG PO TABS: 500.0000 mg | ORAL_TABLET | Freq: Two times a day (BID) | ORAL | 0 refills | Status: DC

## 2016-01-10 MED ORDER — IOPAMIDOL (ISOVUE-300) INJECTION 61%
30.0000 mL | Freq: Once | INTRAVENOUS | Status: AC | PRN
  Administered 2016-01-10: 30 mL via ORAL

## 2016-01-10 MED ORDER — IOPAMIDOL (ISOVUE-300) INJECTION 61%
125.0000 mL | Freq: Once | INTRAVENOUS | Status: AC | PRN
  Administered 2016-01-10: 125 mL via INTRAVENOUS

## 2016-01-10 MED ORDER — METRONIDAZOLE 500 MG PO TABS
500.0000 mg | ORAL_TABLET | Freq: Once | ORAL | Status: AC
  Administered 2016-01-10: 500 mg via ORAL
  Filled 2016-01-10: qty 1

## 2016-01-10 MED ORDER — METRONIDAZOLE 250 MG PO TABS: 250.0000 mg | ORAL_TABLET | Freq: Two times a day (BID) | ORAL | 0 refills | Status: DC

## 2016-01-10 MED ORDER — CIPROFLOXACIN HCL 500 MG PO TABS
500.0000 mg | ORAL_TABLET | Freq: Once | ORAL | Status: AC
  Administered 2016-01-10: 500 mg via ORAL
  Filled 2016-01-10: qty 1

## 2016-01-10 MED ORDER — OXYCODONE-ACETAMINOPHEN 5-325 MG PO TABS
1.0000 | ORAL_TABLET | Freq: Once | ORAL | Status: AC
  Administered 2016-01-10: 1 via ORAL
  Filled 2016-01-10: qty 1

## 2016-01-10 MED ORDER — DIPHENHYDRAMINE HCL 50 MG/ML IJ SOLN
50.0000 mg | Freq: Once | INTRAMUSCULAR | Status: AC
  Administered 2016-01-10: 50 mg via INTRAVENOUS

## 2016-01-10 NOTE — ED Triage Notes (Signed)
Pt presents to ED with c/o generalized abd pain and fever since Saturday. Hx of the same when dx with diverticulitis. Pt states her pain has increased in severity since onset. Nausea+. Pt states she took a laxative in hopes to relieve some of the pressure in her abd. Pt appears restless in triage.

## 2016-01-10 NOTE — ED Provider Notes (Signed)
[facility] Emergency Department Provider Note   ____________________________________________   First MD Initiated Contact with Patient 01/10/16 0533     (approximate)  I have reviewed the triage vital signs and the nursing notes.   HISTORY  Chief Complaint Abdominal Pain; Constipation; and Fever    HPI Rachael Gonzales is a 50 y.o. female who comes into the hospital today with fever and abdominal pain. The patient reports that she got up Saturday morning and thought that she was started.Marland Kitchen She was having some cramping. She had a temperature to 102.8 on Saturday. She has been using Tylenol which has helped and she is also taken some ibuprofen. She reports that the pain has been all over her abdomen. She endorses some midabdominal, epigastric and generalized abdominal pain. She thought maybe she had the flu but the pain in her belly Getting worse. The patient denies any pain with urination and no vomiting. She's had some nausea. The patient rates her pain a 6-7 out of 10 in intensity. She's had some constipation with no headache or blurred vision. She did take some laxatives and stool softeners before she came in and did have a bowel movement when she arrived here. The patient is here for evaluation. She has had diverticulitis in the past.   Past Medical History:  Diagnosis Date  . Allergy   . Depression   . Diverticulitis   . Irritable bowel     Patient Active Problem List   Diagnosis Date Noted  . Depression 12/30/2014  . 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 Adult BMI 30+ 08/14/2014  . Acute situational disturbance 08/14/2014  . Lateral epicondylitis of left elbow 07/04/2013  . Heel pain, bilateral 05/26/2013    Past Surgical History:  Procedure Laterality Date  . BACK SURGERY  04/17/1990   L5-S1  . CESAREAN SECTION      Prior to Admission medications   Medication Sig Start Date End Date Taking? Authorizing Provider  amoxicillin (AMOXIL) 875 MG  tablet Take 1 tablet by mouth 2 (two) times daily. 12/04/15   Historical Provider, MD  benzonatate (TESSALON) 200 MG capsule Take 1 capsule by mouth 3 (three) times daily. 12/04/15   Historical Provider, MD  ciprofloxacin (CIPRO) 500 MG tablet Take 1 tablet (500 mg total) by mouth 2 (two) times daily. 01/10/16 01/20/16  Loney Hering, MD  dicyclomine (BENTYL) 10 MG capsule TAKE 1 CAPSULE BY MOUTH 3 TIMES A DAY 09/14/15   [name], MD  loratadine (CLARITIN) 10 MG tablet Take 1 tablet by mouth 1 day or 1 dose.    Historical Provider, MD  metroNIDAZOLE (FLAGYL) 250 MG tablet Take 1 tablet (250 mg total) by mouth 2 (two) times daily. 01/10/16 01/20/16  Loney Hering, MD  Multiple Vitamins-Minerals (MULTIVITAMIN & MINERAL) LIQD Take 1 tablet by mouth 1 day or 1 dose. pill    Historical Provider, MD  ondansetron (ZOFRAN ODT) 4 MG disintegrating tablet Take 1 tablet (4 mg total) by mouth every 8 (eight) hours as needed for nausea or vomiting. 01/10/16   Loney Hering, MD  oxyCODONE-acetaminophen (ROXICET) 5-325 MG tablet Take 1 tablet by mouth every 6 (six) hours as needed. 01/10/16   Loney Hering, MD  triamcinolone cream (KENALOG) 0.1 % Apply 1 application topically 2 (two) times daily. 05/25/15   [name], MD    Allergies Iodinated diagnostic agents  Family History  Problem Relation Age of Onset  . COPD Mother   . Asthma  Mother   . Cancer Father   . Sudden death Neg Hx   . Hypertension Neg Hx   . Heart attack Neg Hx   . Diabetes Neg Hx   . Hyperlipidemia Neg Hx     Social History Social History  Substance Use Topics  . Smoking status: Former Research scientist (life sciences)  . Smokeless tobacco: Never Used  . Alcohol use 0.0 oz/week    Review of Systems Constitutional:  fever/chills Eyes: No visual changes. ENT: No sore throat. Cardiovascular: Denies chest pain. Respiratory: Denies shortness of breath. Gastrointestinal:  abdominal pain.   nausea, no vomiting.  No diarrhea.  No  constipation. Genitourinary: Negative for dysuria. Musculoskeletal: Negative for back pain. Skin: Negative for rash. Neurological: Negative for headaches, focal weakness or numbness.  10-point ROS otherwise negative.  ____________________________________________   PHYSICAL EXAM:  VITAL SIGNS: ED Triage Vitals [01/10/16 0243]  Enc Vitals Group     BP 117/71     Pulse Rate 89     Resp 20     Temp 98.3 F (36.8 C)     Temp Source Oral     SpO2 96 %     Weight 290 lb (131.5 kg)     Height 5\' 9"  (1.753 m)     Head Circumference      Peak Flow      Pain Score 6     Pain Loc      Pain Edu?      Excl. in [place]?     Constitutional: Alert and oriented. Well appearing and in Moderate distress. Eyes: Conjunctivae are normal. PERRL. EOMI. Head: Atraumatic. Nose: No congestion/rhinnorhea. Mouth/Throat: Mucous membranes are moist.  Oropharynx non-erythematous. Cardiovascular: Normal rate, regular rhythm. Grossly normal heart sounds.  Good peripheral circulation. Respiratory: Normal respiratory effort.  No retractions. Lungs CTAB. Gastrointestinal: Soft with lower abdominal tenderness to palpation. No distention. Positive bowel sounds Musculoskeletal: No lower extremity tenderness nor edema.   Neurologic:  Normal speech and language.  Skin:  Skin is warm, dry and intact.  Psychiatric: Mood and affect are normal.   ____________________________________________   LABS (all labs ordered are listed, but only abnormal results are displayed)  Labs Reviewed  COMPREHENSIVE METABOLIC PANEL - Abnormal; Notable for the following:       Result Value   Sodium 134 (*)    Glucose, Bld 112 (*)    AST 103 (*)    ALT 110 (*)    All other components within normal limits  URINALYSIS COMPLETEWITH MICROSCOPIC ([facility] ONLY) - Abnormal; Notable for the following:    Color, Urine YELLOW (*)    APPearance CLEAR (*)    Ketones, ur TRACE (*)    Specific Gravity, Urine 1.003 (*)    Bacteria, UA RARE  (*)    Squamous Epithelial / LPF 0-5 (*)    All other components within normal limits  LIPASE, BLOOD  CBC   ____________________________________________  EKG  None ____________________________________________  RADIOLOGY  CT abdomen and pelvis ____________________________________________   PROCEDURES  Procedure(s) performed: None  Procedures  Critical Care performed: No  ____________________________________________   INITIAL IMPRESSION / ASSESSMENT AND PLAN / ED COURSE  Pertinent labs & imaging results that were available during my care of the patient were reviewed by me and considered in my medical decision making (see chart for details).  This is a 50 year old female who comes into the hospital today with lower abdominal pain. The patient is also had some fevers and nausea at home. The patient does  have a history of diverticulitis which makes me concerned she may be having another episode of diverticulitis. I will send the patient for a CT scan. Her blood work is unremarkable. I will give her some fluids and reassess the patient. The patient is in no significant distress at this time.  Clinical Course  Value Comment By Time  CT Abdomen Pelvis W Contrast Sigmoid diverticulitis.  No abscess. Loney Hering, MD 09/25 [id number]    According to the CT scan it appears as though the patient has diverticulitis. I will give her some oral ciprofloxacin as well as metronidazole. The patient will also receive a dose of Percocet and Zofran for her pain. As the patient is not vomiting and she is able to take by mouth I think we may be able to try treating the patient's symptoms at home. I discussed this with the patient and her partner and they agree to the plan as stated. The patient will be discharged to home. ____________________________________________   FINAL CLINICAL IMPRESSION(S) / ED DIAGNOSES  Final diagnoses:  Diverticulitis of large intestine without perforation or  abscess without bleeding      NEW MEDICATIONS STARTED DURING THIS VISIT:  New Prescriptions   CIPROFLOXACIN (CIPRO) 500 MG TABLET    Take 1 tablet (500 mg total) by mouth 2 (two) times daily.   METRONIDAZOLE (FLAGYL) 250 MG TABLET    Take 1 tablet (250 mg total) by mouth 2 (two) times daily.   ONDANSETRON (ZOFRAN ODT) 4 MG DISINTEGRATING TABLET    Take 1 tablet (4 mg total) by mouth every 8 (eight) hours as needed for nausea or vomiting.   OXYCODONE-ACETAMINOPHEN (ROXICET) 5-325 MG TABLET    Take 1 tablet by mouth every 6 (six) hours as needed.     Note:  This document was prepared using Dragon voice recognition software and may include unintentional dictation errors.    Loney Hering, MD 01/10/16 [phone]

## 2016-01-10 NOTE — ED Notes (Signed)
Pt unable to void at this time; provided with sterile container and wipes, understands as soon as she can provide a small sample to let the first nurse know

## 2016-01-11 DIAGNOSIS — Z87891 Personal history of nicotine dependence: Secondary | ICD-10-CM | POA: Diagnosis not present

## 2016-01-11 DIAGNOSIS — R109 Unspecified abdominal pain: Secondary | ICD-10-CM | POA: Diagnosis not present

## 2016-01-11 DIAGNOSIS — K5793 Diverticulitis of intestine, part unspecified, without perforation or abscess with bleeding: Secondary | ICD-10-CM | POA: Diagnosis not present

## 2016-01-11 DIAGNOSIS — K625 Hemorrhage of anus and rectum: Secondary | ICD-10-CM | POA: Diagnosis not present

## 2016-01-11 DIAGNOSIS — R509 Fever, unspecified: Secondary | ICD-10-CM | POA: Diagnosis not present

## 2016-01-13 ENCOUNTER — Ambulatory Visit (INDEPENDENT_AMBULATORY_CARE_PROVIDER_SITE_OTHER): Payer: BLUE CROSS/BLUE SHIELD | Admitting: Family Medicine

## 2016-01-13 ENCOUNTER — Encounter: Payer: Self-pay | Admitting: Family Medicine

## 2016-01-13 VITALS — BP 118/72 | HR 72 | Ht 69.0 in | Wt 280.0 lb

## 2016-01-13 DIAGNOSIS — E860 Dehydration: Secondary | ICD-10-CM | POA: Diagnosis not present

## 2016-01-13 DIAGNOSIS — K5733 Diverticulitis of large intestine without perforation or abscess with bleeding: Secondary | ICD-10-CM | POA: Diagnosis not present

## 2016-01-13 NOTE — Progress Notes (Addendum)
Name: Rachael Gonzales   MRN: [id number]    DOB: 04-17-1966   Date:01/13/2016       Progress Note  Subjective  Chief Complaint  Chief Complaint  Patient presents with  . Follow-up    hospital- Dx with Diverticulitis- feeling better/ still taking antibiotics    Patient presents for follow up for diverticulosis.    Abdominal Pain  This is a new problem. The current episode started in the past 7 days. The onset quality is gradual. The problem occurs intermittently. The problem has been waxing and waning. The pain is located in the suprapubic region. The pain is at a severity of 3/10. The pain is moderate. The quality of the pain is cramping. The abdominal pain radiates to the back. Associated symptoms include diarrhea and hematochezia. Pertinent negatives include no anorexia, arthralgias, belching, constipation, dysuria, fever, flatus, frequency, headaches, hematuria, melena, myalgias, nausea, vomiting or weight loss.    No problem-specific Assessment & Plan notes found for this encounter.   Past Medical History:  Diagnosis Date  . Allergy   . Depression   . Diverticulitis   . Irritable bowel     Past Surgical History:  Procedure Laterality Date  . BACK SURGERY  04/17/1990   L5-S1  . CESAREAN SECTION      Family History  Problem Relation Age of Onset  . COPD Mother   . Asthma Mother   . Cancer Father   . Sudden death Neg Hx   . Hypertension Neg Hx   . Heart attack Neg Hx   . Diabetes Neg Hx   . Hyperlipidemia Neg Hx     Social History   Social History  . Marital status: Married    Spouse name: N/A  . Number of children: N/A  . Years of education: N/A   Occupational History  . Not on file.   Social History Main Topics  . Smoking status: Former Research scientist (life sciences)  . Smokeless tobacco: Never Used  . Alcohol use 0.0 oz/week  . Drug use: No  . Sexual activity: Yes   Other Topics Concern  . Not on file   Social History Narrative  . No narrative on file    Allergies   Allergen Reactions  . Iodinated Diagnostic Agents Hives    CT A/P w on 01/10/16. Itching and hives.     Review of Systems  Constitutional: Negative for chills, fever, malaise/fatigue and weight loss.  HENT: Negative for ear discharge, ear pain and sore throat.   Eyes: Negative for blurred vision.  Respiratory: Negative for cough, sputum production, shortness of breath and wheezing.   Cardiovascular: Negative for chest pain, palpitations and leg swelling.  Gastrointestinal: Positive for abdominal pain, diarrhea and hematochezia. Negative for anorexia, blood in stool, constipation, flatus, heartburn, melena, nausea and vomiting.  Genitourinary: Negative for dysuria, frequency, hematuria and urgency.  Musculoskeletal: Negative for arthralgias, back pain, joint pain, myalgias and neck pain.  Skin: Negative for rash.  Neurological: Positive for dizziness. Negative for tingling, sensory change, focal weakness and headaches.       Walking  Endo/Heme/Allergies: Negative for environmental allergies and polydipsia. Does not bruise/bleed easily.  Psychiatric/Behavioral: Negative for depression and suicidal ideas. The patient is not nervous/anxious and does not have insomnia.      Objective  Vitals:   01/13/16 1105  BP: 118/72  Pulse: 72  Weight: 280 lb (127 kg)  Height: 5\' 9"  (1.753 m)    Physical Exam  Constitutional: She is well-developed, well-nourished, and  in no distress. No distress.  HENT:  Head: Normocephalic and atraumatic.  Right Ear: External ear normal.  Left Ear: External ear normal.  Nose: Nose normal.  Mouth/Throat: Oropharynx is clear and moist.  Eyes: Conjunctivae and EOM are normal. Pupils are equal, round, and reactive to light. Right eye exhibits no discharge. Left eye exhibits no discharge.  Neck: Normal range of motion. Neck supple. No JVD present. No thyromegaly present.  Cardiovascular: Normal rate, regular rhythm, normal heart sounds and intact distal pulses.   Exam reveals no gallop and no friction rub.   No murmur heard. Pulmonary/Chest: Effort normal and breath sounds normal. No respiratory distress. She has no wheezes. She has no rales.  Abdominal: Soft. Bowel sounds are normal. She exhibits no distension and no mass. There is tenderness. There is no rebound and no guarding.  Musculoskeletal: Normal range of motion. She exhibits no edema.  Lymphadenopathy:    She has no cervical adenopathy.  Neurological: She is alert. She has normal reflexes.  Skin: Skin is warm and dry. She is not diaphoretic.  Psychiatric: Mood and affect normal.  Nursing note and vitals reviewed.     Assessment & Plan  Problem List Items Addressed This Visit    None    Visit Diagnoses    Diverticulitis of large intestine without perforation or abscess with bleeding    -  Primary   Relevant Orders   Renal Function Panel   CBC with Differential   Dehydration            Dr. Otilio Miu [facility]  01/13/16

## 2016-01-13 NOTE — Patient Instructions (Signed)

## 2016-01-14 LAB — CBC WITH DIFFERENTIAL/PLATELET
BASOS ABS: 0 10*3/uL (ref 0.0–0.2)
BASOS: 1 %
EOS (ABSOLUTE): 0.1 10*3/uL (ref 0.0–0.4)
EOS: 1 %
HEMATOCRIT: 38.6 % (ref 34.0–46.6)
HEMOGLOBIN: 13 g/dL (ref 11.1–15.9)
IMMATURE GRANS (ABS): 0.1 10*3/uL (ref 0.0–0.1)
Immature Granulocytes: 1 %
LYMPHS: 22 %
Lymphocytes Absolute: 1.5 10*3/uL (ref 0.7–3.1)
MCH: 30.3 pg (ref 26.6–33.0)
MCHC: 33.7 g/dL (ref 31.5–35.7)
MCV: 90 fL (ref 79–97)
MONOCYTES: 12 %
Monocytes Absolute: 0.9 10*3/uL (ref 0.1–0.9)
NEUTROS ABS: 4.5 10*3/uL (ref 1.4–7.0)
Neutrophils: 63 %
PLATELETS: 301 10*3/uL (ref 150–379)
RBC: 4.29 x10E6/uL (ref 3.77–5.28)
RDW: 13.5 % (ref 12.3–15.4)
WBC: 7 10*3/uL (ref 3.4–10.8)

## 2016-01-14 LAB — RENAL FUNCTION PANEL
ALBUMIN: 4 g/dL (ref 3.5–5.5)
BUN / CREAT RATIO: 10 (ref 9–23)
BUN: 5 mg/dL — AB (ref 6–24)
CO2: 26 mmol/L (ref 18–29)
CREATININE: 0.49 mg/dL — AB (ref 0.57–1.00)
Calcium: 9 mg/dL (ref 8.7–10.2)
Chloride: 97 mmol/L (ref 96–106)
GFR, EST AFRICAN AMERICAN: 133 mL/min/{1.73_m2} (ref >59)
GFR, EST NON AFRICAN AMERICAN: 116 mL/min/{1.73_m2} (ref >59)
Glucose: 97 mg/dL (ref 65–99)
Phosphorus: 2.6 mg/dL (ref 2.5–4.5)
Potassium: 4.2 mmol/L (ref 3.5–5.2)
Sodium: 138 mmol/L (ref 134–144)

## 2016-01-20 ENCOUNTER — Encounter: Payer: Self-pay | Admitting: Family Medicine

## 2016-01-20 ENCOUNTER — Ambulatory Visit (INDEPENDENT_AMBULATORY_CARE_PROVIDER_SITE_OTHER): Payer: BLUE CROSS/BLUE SHIELD | Admitting: Family Medicine

## 2016-01-20 VITALS — BP 102/64 | HR 70 | Ht 69.0 in | Wt 283.0 lb

## 2016-01-20 DIAGNOSIS — R1084 Generalized abdominal pain: Secondary | ICD-10-CM

## 2016-01-20 NOTE — Progress Notes (Signed)
Name: Rachael Gonzales   MRN: [id number]    DOB: 1965-08-21   Date:01/20/2016       Progress Note  Subjective  Chief Complaint  Chief Complaint  Patient presents with  . Follow-up    finished Cipro and Flaggyl today- still having bloating but no pain- referral to GI    Abdominal Pain  This is a recurrent problem. The current episode started 1 to 4 weeks ago. The onset quality is gradual. The problem occurs intermittently. The problem has been waxing and waning. The pain is moderate. The quality of the pain is aching. The abdominal pain radiates to the RLQ and epigastric region. Pertinent negatives include no fever, frequency, hematochezia, hematuria, melena or weight loss.    No problem-specific Assessment & Plan notes found for this encounter.   Past Medical History:  Diagnosis Date  . Allergy   . Depression   . Diverticulitis   . Irritable bowel     Past Surgical History:  Procedure Laterality Date  . BACK SURGERY  04/17/1990   L5-S1  . CESAREAN SECTION      Family History  Problem Relation Age of Onset  . COPD Mother   . Asthma Mother   . Cancer Father   . Sudden death Neg Hx   . Hypertension Neg Hx   . Heart attack Neg Hx   . Diabetes Neg Hx   . Hyperlipidemia Neg Hx     Social History   Social History  . Marital status: Married    Spouse name: N/A  . Number of children: N/A  . Years of education: N/A   Occupational History  . Not on file.   Social History Main Topics  . Smoking status: Former Research scientist (life sciences)  . Smokeless tobacco: Never Used  . Alcohol use 0.0 oz/week  . Drug use: No  . Sexual activity: Yes   Other Topics Concern  . Not on file   Social History Narrative  . No narrative on file    Allergies  Allergen Reactions  . Iodinated Diagnostic Agents Hives    CT A/P w on 01/10/16. Itching and hives.     Review of Systems  Constitutional: Negative for fever and weight loss.  Gastrointestinal: Positive for abdominal pain. Negative for  hematochezia and melena.  Genitourinary: Negative for frequency and hematuria.     Objective  Vitals:   01/20/16 1019  BP: 102/64  Pulse: 70  Weight: 283 lb (128.4 kg)  Height: 5\' 9"  (1.753 m)    Physical Exam  Constitutional: She is well-developed, well-nourished, and in no distress. No distress.  HENT:  Head: Normocephalic and atraumatic.  Right Ear: External ear normal.  Left Ear: External ear normal.  Nose: Nose normal.  Mouth/Throat: Oropharynx is clear and moist.  Eyes: Conjunctivae and EOM are normal. Pupils are equal, round, and reactive to light. Right eye exhibits no discharge. Left eye exhibits no discharge.  Neck: Normal range of motion. Neck supple. No JVD present. No thyromegaly present.  Cardiovascular: Normal rate, regular rhythm, normal heart sounds and intact distal pulses.  Exam reveals no gallop and no friction rub.   No murmur heard. Pulmonary/Chest: Effort normal and breath sounds normal.  Abdominal: Soft. Bowel sounds are normal. She exhibits no mass. There is no tenderness. There is no guarding.  Musculoskeletal: Normal range of motion. She exhibits no edema.  Lymphadenopathy:    She has no cervical adenopathy.  Neurological: She is alert. She has normal reflexes.  Skin: Skin  is warm and dry. She is not diaphoretic.  Psychiatric: Mood and affect normal.  Nursing note and vitals reviewed.     Assessment & Plan  Problem List Items Addressed This Visit    None    Visit Diagnoses    Generalized abdominal pain    -  Primary   Relevant Orders   H. pylori antibody, IgG        Dr. [facility]  01/20/16

## 2016-01-21 LAB — H. PYLORI ANTIBODY, IGG: H PYLORI IGG: 1.1 U/mL — AB (ref 0.0–0.8)

## 2016-01-24 ENCOUNTER — Other Ambulatory Visit: Payer: Self-pay

## 2016-01-24 DIAGNOSIS — A048 Other specified bacterial intestinal infections: Secondary | ICD-10-CM

## 2016-01-24 MED ORDER — CLARITHROMYCIN 500 MG PO TABS: 500.0000 mg | ORAL_TABLET | Freq: Two times a day (BID) | ORAL | 0 refills | Status: DC

## 2016-01-24 MED ORDER — AMOXICILLIN 500 MG PO CAPS: 500.0000 mg | ORAL_CAPSULE | Freq: Two times a day (BID) | ORAL | 0 refills | Status: DC

## 2016-02-02 DIAGNOSIS — L821 Other seborrheic keratosis: Secondary | ICD-10-CM | POA: Diagnosis not present

## 2016-02-23 ENCOUNTER — Encounter: Payer: Self-pay | Admitting: Gastroenterology

## 2016-02-23 ENCOUNTER — Other Ambulatory Visit: Payer: Self-pay

## 2016-02-23 ENCOUNTER — Ambulatory Visit (INDEPENDENT_AMBULATORY_CARE_PROVIDER_SITE_OTHER): Payer: BLUE CROSS/BLUE SHIELD | Admitting: Gastroenterology

## 2016-02-23 VITALS — BP 128/72 | HR 66 | Temp 97.9°F | Wt 286.0 lb

## 2016-02-23 DIAGNOSIS — K5732 Diverticulitis of large intestine without perforation or abscess without bleeding: Secondary | ICD-10-CM

## 2016-02-23 DIAGNOSIS — K219 Gastro-esophageal reflux disease without esophagitis: Secondary | ICD-10-CM

## 2016-02-23 NOTE — Progress Notes (Signed)
Gastroenterology Consultation  Referring Provider:     [name], MD Primary Care Physician:  Rachael Miu, MD Primary Gastroenterologist:  Dr. Allen Gonzales     Reason for Consultation:     Diverticulitis        HPI:   Rachael Gonzales is a 50 y.o. y/o female referred for consultation & management of Diverticulitis by Dr. Otilio Miu, MD.  This patient comes to see me today after being treated for diverticulitis.  The patient subsequently had some bloating that she recalls to have been most of her life.  The patient also states that she has irritable bowel syndrome with diarrhea predominance.  The patient was started on dicyclomine and states that it helped her a lot.  The patient also has had chronic heartburn for which she is not taking anything at the present time.  She is concerned that there may be something more seriously wrong with her since she has a strong family history of cancer.  There is no report of any black stools or bloody stools.  She denies any unexplained weight loss.  Her abdominal pain has since resolved since being treated for the diverticulitis.  Past Medical History:  Diagnosis Date  . Allergy   . Depression   . Diverticulitis   . Irritable bowel     Past Surgical History:  Procedure Laterality Date  . BACK SURGERY  04/17/1990   L5-S1  . CESAREAN SECTION      Prior to Admission medications   Medication Sig Start Date End Date Taking? Authorizing Provider  dicyclomine (BENTYL) 10 MG capsule TAKE 1 CAPSULE BY MOUTH 3 TIMES A DAY Patient taking differently: TAKE 1 CAPSULE BY MOUTH 3 TIMES A DAY PRN 09/14/15  Yes Rachael Patch, MD  loratadine (CLARITIN) 10 MG tablet Take 1 tablet by mouth 1 day or 1 dose.   Yes Historical Provider, MD  Multiple Vitamins-Minerals (MULTIVITAMIN & MINERAL) LIQD Take 1 tablet by mouth 1 day or 1 dose. pill   Yes Historical Provider, MD  amoxicillin (AMOXIL) 500 MG capsule Take 1 capsule (500 mg total) by mouth 2 (two) times  daily. Patient not taking: Reported on 02/23/2016 01/24/16   Rachael Patch, MD  clarithromycin (BIAXIN) 500 MG tablet Take 1 tablet (500 mg total) by mouth 2 (two) times daily. Patient not taking: Reported on 02/23/2016 01/24/16   Rachael Patch, MD  ondansetron (ZOFRAN ODT) 4 MG disintegrating tablet Take 1 tablet (4 mg total) by mouth every 8 (eight) hours as needed for nausea or vomiting. Patient not taking: Reported on 02/23/2016 01/10/16   Loney Hering, MD  oxyCODONE-acetaminophen (ROXICET) 5-325 MG tablet Take 1 tablet by mouth every 6 (six) hours as needed. Patient not taking: Reported on 02/23/2016 01/10/16   Loney Hering, MD    Family History  Problem Relation Age of Onset  . COPD Mother   . Asthma Mother   . Cancer Father   . Sudden death Neg Hx   . Hypertension Neg Hx   . Heart attack Neg Hx   . Diabetes Neg Hx   . Hyperlipidemia Neg Hx      Social History  Substance Use Topics  . Smoking status: Former Research scientist (life sciences)  . Smokeless tobacco: Never Used  . Alcohol use 0.0 oz/week    Allergies as of 02/23/2016 - Review Complete 01/20/2016  Allergen Reaction Noted  . Iodinated diagnostic agents Hives 01/10/2016    Review of Systems:  All systems reviewed and negative except where noted in HPI.   Physical Exam:  BP 128/72   Pulse 66   Temp 97.9 F (36.6 C) (Oral)   Wt 286 lb (129.7 kg)   BMI 42.23 kg/m  No LMP recorded. Psych:  Alert and cooperative. Normal mood and affect. General:   Alert,  Well-developed, Obese, well-nourished, pleasant and cooperative in NAD Head:  Normocephalic and atraumatic. Eyes:  Sclera clear, no icterus.   Conjunctiva pink. Ears:  Normal auditory acuity. Nose:  No deformity, discharge, or lesions. Mouth:  No deformity or lesions,oropharynx pink & moist. Neck:  Supple; no masses or thyromegaly. Lungs:  Respirations even and unlabored.  Clear throughout to auscultation.   No wheezes, crackles, or rhonchi. No acute distress. Heart:   Regular rate and rhythm; no murmurs, clicks, rubs, or gallops. Abdomen:  Normal bowel sounds.  No bruits.  Soft, non-tender and non-distended without masses, hepatosplenomegaly or hernias noted.  No guarding or rebound tenderness.  Negative Carnett sign.   Rectal:  Deferred.  Msk:  Symmetrical without gross deformities.  Good, equal movement & strength bilaterally. Pulses:  Normal pulses noted. Extremities:  No clubbing or edema.  No cyanosis. Neurologic:  Alert and oriented x3;  grossly normal neurologically. Skin:  Intact without significant lesions or rashes.  No jaundice. Lymph Nodes:  No significant cervical adenopathy. Psych:  Alert and cooperative. Normal mood and affect.  Imaging Studies: No results found.  Assessment and Plan:   Rachael Gonzales is a 50 y.o. y/o female Who comes here after having been treated for diverticulitis.  The patient was found to have diverticulitis on CT scan.  She also has a history of chronic heartburn.  Her irritable bowel syndrome is controlled with dicyclomine.  She does have a lot of gas and bloating and has been told to avoid chewing gum which she states she does quite often.  The patient will be set up for an EGD and colonoscopy. I have discussed risks & benefits which include, but are not limited to, bleeding, infection, perforation & drug reaction.  The patient agrees with this plan & written consent will be obtained.       Rachael Lame, MD. Marval Regal   Note: This dictation was prepared with Dragon dictation along with smaller phrase technology. Any transcriptional errors that result from this process are unintentional.

## 2016-02-24 MED FILL — SUPREP BOWEL PREP KIT: 17.5-3.13-1 | 1 days supply | Qty: 354 | Fill #0

## 2016-02-28 ENCOUNTER — Encounter: Payer: Self-pay | Admitting: *Deleted

## 2016-03-01 DIAGNOSIS — Z01419 Encounter for gynecological examination (general) (routine) without abnormal findings: Secondary | ICD-10-CM | POA: Diagnosis not present

## 2016-03-01 DIAGNOSIS — Z124 Encounter for screening for malignant neoplasm of cervix: Secondary | ICD-10-CM | POA: Diagnosis not present

## 2016-03-02 NOTE — Discharge Instructions (Signed)

## 2016-03-03 ENCOUNTER — Ambulatory Visit: Payer: BLUE CROSS/BLUE SHIELD | Admitting: Anesthesiology

## 2016-03-03 ENCOUNTER — Encounter
Admission: RE | Disposition: A | Discharge status: Home / Self Care | Payer: Self-pay | Source: Ambulatory Visit | Attending: Gastroenterology

## 2016-03-03 ENCOUNTER — Ambulatory Visit
Admission: RE | Admit: 2016-03-03 | Discharge: 2016-03-03 | Disposition: A | Discharge status: Home / Self Care | Payer: BLUE CROSS/BLUE SHIELD | Source: Ambulatory Visit | Attending: Gastroenterology | Admitting: Gastroenterology

## 2016-03-03 DIAGNOSIS — K5730 Diverticulosis of large intestine without perforation or abscess without bleeding: Secondary | ICD-10-CM | POA: Insufficient documentation

## 2016-03-03 DIAGNOSIS — K641 Second degree hemorrhoids: Secondary | ICD-10-CM | POA: Diagnosis not present

## 2016-03-03 DIAGNOSIS — K2100 Gastro-esophageal reflux disease with esophagitis, without bleeding: Secondary | ICD-10-CM

## 2016-03-03 DIAGNOSIS — D124 Benign neoplasm of descending colon: Secondary | ICD-10-CM | POA: Diagnosis not present

## 2016-03-03 DIAGNOSIS — Z87891 Personal history of nicotine dependence: Secondary | ICD-10-CM | POA: Insufficient documentation

## 2016-03-03 DIAGNOSIS — K589 Irritable bowel syndrome without diarrhea: Secondary | ICD-10-CM | POA: Insufficient documentation

## 2016-03-03 DIAGNOSIS — Z91041 Radiographic dye allergy status: Secondary | ICD-10-CM | POA: Diagnosis not present

## 2016-03-03 DIAGNOSIS — K5732 Diverticulitis of large intestine without perforation or abscess without bleeding: Secondary | ICD-10-CM | POA: Diagnosis not present

## 2016-03-03 DIAGNOSIS — K635 Polyp of colon: Secondary | ICD-10-CM | POA: Diagnosis not present

## 2016-03-03 DIAGNOSIS — K2950 Unspecified chronic gastritis without bleeding: Secondary | ICD-10-CM | POA: Insufficient documentation

## 2016-03-03 DIAGNOSIS — K3189 Other diseases of stomach and duodenum: Secondary | ICD-10-CM | POA: Insufficient documentation

## 2016-03-03 DIAGNOSIS — K2900 Acute gastritis without bleeding: Secondary | ICD-10-CM | POA: Diagnosis not present

## 2016-03-03 DIAGNOSIS — F329 Major depressive disorder, single episode, unspecified: Secondary | ICD-10-CM | POA: Insufficient documentation

## 2016-03-03 DIAGNOSIS — R12 Heartburn: Secondary | ICD-10-CM

## 2016-03-03 DIAGNOSIS — K649 Unspecified hemorrhoids: Secondary | ICD-10-CM | POA: Diagnosis not present

## 2016-03-03 HISTORY — PX: ESOPHAGOGASTRODUODENOSCOPY (EGD) WITH PROPOFOL: SHX5813

## 2016-03-03 HISTORY — PX: POLYPECTOMY: SHX5525

## 2016-03-03 HISTORY — PX: COLONOSCOPY WITH PROPOFOL: SHX5780

## 2016-03-03 HISTORY — DX: Headache, unspecified: R51.9

## 2016-03-03 HISTORY — DX: Headache: R51

## 2016-03-03 SURGERY — COLONOSCOPY WITH PROPOFOL
Anesthesia: Monitor Anesthesia Care | Wound class: Contaminated

## 2016-03-03 MED ORDER — GLYCOPYRROLATE 0.2 MG/ML IJ SOLN
INTRAMUSCULAR | Status: DC | PRN
  Administered 2016-03-03: 0.2 mg via INTRAVENOUS

## 2016-03-03 MED ORDER — PROPOFOL 10 MG/ML IV BOLUS
INTRAVENOUS | Status: DC | PRN
  Administered 2016-03-03: 30 mg via INTRAVENOUS
  Administered 2016-03-03 (×4): 20 mg via INTRAVENOUS
  Administered 2016-03-03: 40 mg via INTRAVENOUS
  Administered 2016-03-03: 20 mg via INTRAVENOUS
  Administered 2016-03-03 (×3): 30 mg via INTRAVENOUS
  Administered 2016-03-03: 20 mg via INTRAVENOUS
  Administered 2016-03-03: 30 mg via INTRAVENOUS
  Administered 2016-03-03: 70 mg via INTRAVENOUS
  Administered 2016-03-03 (×2): 20 mg via INTRAVENOUS
  Administered 2016-03-03: 30 mg via INTRAVENOUS

## 2016-03-03 MED ORDER — PANTOPRAZOLE SODIUM 40 MG PO TBEC: 40.0000 mg | DELAYED_RELEASE_TABLET | Freq: Every day | ORAL | 6 refills | Status: DC

## 2016-03-03 MED ORDER — LIDOCAINE HCL (CARDIAC) 20 MG/ML IV SOLN
INTRAVENOUS | Status: DC | PRN
  Administered 2016-03-03: 40 mg via INTRAVENOUS

## 2016-03-03 MED ORDER — LACTATED RINGERS IV SOLN
INTRAVENOUS | Status: DC
  Administered 2016-03-03: 10:00:00 via INTRAVENOUS

## 2016-03-03 SURGICAL SUPPLY — 35 items
BALLN DILATOR 10-12 8 (BALLOONS)
BALLN DILATOR 12-15 8 (BALLOONS)
BALLN DILATOR 15-18 8 (BALLOONS)
BALLN DILATOR CRE 0-12 8 (BALLOONS)
BALLN DILATOR ESOPH 8 10 CRE (MISCELLANEOUS) IMPLANT
BALLOON DILATOR 12-15 8 (BALLOONS) IMPLANT
BALLOON DILATOR 15-18 8 (BALLOONS) IMPLANT
BALLOON DILATOR CRE 0-12 8 (BALLOONS) IMPLANT
BLOCK BITE 60FR ADLT L/F GRN (MISCELLANEOUS) ×3 IMPLANT
CANISTER SUCT 1200ML W/VALVE (MISCELLANEOUS) ×3 IMPLANT
CLIP HMST 235XBRD CATH ROT (MISCELLANEOUS) IMPLANT
CLIP RESOLUTION 360 11X235 (MISCELLANEOUS)
FCP ESCP3.2XJMB 240X2.8X (MISCELLANEOUS)
FORCEPS BIOP RAD 4 LRG CAP 4 (CUTTING FORCEPS) ×3 IMPLANT
FORCEPS BIOP RJ4 240 W/NDL (MISCELLANEOUS)
FORCEPS ESCP3.2XJMB 240X2.8X (MISCELLANEOUS) IMPLANT
GOWN CVR UNV OPN BCK APRN NK (MISCELLANEOUS) ×4 IMPLANT
GOWN ISOL THUMB LOOP REG UNIV (MISCELLANEOUS) ×2
INJECTOR VARIJECT VIN23 (MISCELLANEOUS) IMPLANT
KIT DEFENDO VALVE AND CONN (KITS) IMPLANT
KIT ENDO PROCEDURE OLY (KITS) ×3 IMPLANT
MARKER SPOT ENDO TATTOO 5ML (MISCELLANEOUS) IMPLANT
PAD GROUND ADULT SPLIT (MISCELLANEOUS) IMPLANT
PROBE APC STR FIRE (PROBE) IMPLANT
RETRIEVER NET PLAT FOOD (MISCELLANEOUS) IMPLANT
RETRIEVER NET ROTH 2.5X230 LF (MISCELLANEOUS) IMPLANT
SNARE SHORT THROW 13M SML OVAL (MISCELLANEOUS) IMPLANT
SNARE SHORT THROW 30M LRG OVAL (MISCELLANEOUS) IMPLANT
SNARE SNG USE RND 15MM (INSTRUMENTS) IMPLANT
SPOT EX ENDOSCOPIC TATTOO (MISCELLANEOUS)
SYR INFLATION 60ML (SYRINGE) IMPLANT
TRAP ETRAP POLY (MISCELLANEOUS) ×3 IMPLANT
VARIJECT INJECTOR VIN23 (MISCELLANEOUS)
WATER STERILE IRR 250ML POUR (IV SOLUTION) ×3 IMPLANT
WIRE CRE 18-20MM 8CM F G (MISCELLANEOUS) IMPLANT

## 2016-03-03 NOTE — H&P (Signed)
Lucilla Lame, MD [facility] [address], [place], [place] [address] Phone: [phone] Fax : [phone]  Primary Care Physician:  Otilio Miu, MD Primary Gastroenterologist:  Dr. Allen Norris  Pre-Procedure History & Physical: HPI:  Rachael Gonzales is a 50 y.o. female is here for an endoscopy and colonoscopy.   Past Medical History:  Diagnosis Date  . Allergy   . Depression   . Diverticulitis   . Headache    sinus  . Irritable bowel     Past Surgical History:  Procedure Laterality Date  . BACK SURGERY  04/17/1990   L5-S1  . CESAREAN SECTION      Prior to Admission medications   Medication Sig Start Date End Date Taking? Authorizing Provider  calcium carbonate (TUMS - DOSED IN MG ELEMENTAL CALCIUM) 500 MG chewable tablet Chew 1 tablet by mouth as needed for indigestion or heartburn.   Yes Historical Provider, MD  loratadine (CLARITIN) 10 MG tablet Take 1 tablet by mouth 1 day or 1 dose.   Yes Historical Provider, MD  Multiple Vitamins-Minerals (MULTIVITAMIN & MINERAL) LIQD Take 1 tablet by mouth 1 day or 1 dose. pill   Yes Historical Provider, MD  Probiotic Product (PROBIOTIC DAILY PO) Take by mouth.   Yes Historical Provider, MD  dicyclomine (BENTYL) 10 MG capsule TAKE 1 CAPSULE BY MOUTH 3 TIMES A DAY Patient not taking: Reported on 02/28/2016 09/14/15   [name], MD    Allergies as of 02/23/2016 - Review Complete 01/20/2016  Allergen Reaction Noted  . Iodinated diagnostic agents Hives 01/10/2016    Family History  Problem Relation Age of Onset  . COPD Mother   . Asthma Mother   . Cancer Father   . Sudden death Neg Hx   . Hypertension Neg Hx   . Heart attack Neg Hx   . Diabetes Neg Hx   . Hyperlipidemia Neg Hx     Social History   Social History  . Marital status: Married    Spouse name: N/A  . Number of children: N/A  . Years of education: N/A   Occupational History  . Not on file.   Social History Main Topics  . Smoking status: Former Smoker      Quit date: 04/18/2011  . Smokeless tobacco: Never Used  . Alcohol use 1.8 oz/week    3 Shots of liquor per week  . Drug use: No  . Sexual activity: Yes   Other Topics Concern  . Not on file   Social History Narrative  . No narrative on file    Review of Systems: See HPI, otherwise negative ROS  Physical Exam: BP 113/73   Pulse 80   Temp 97.6 F (36.4 C) (Tympanic)   Resp 16   Ht 5\' 9"  (1.753 m)   Wt 280 lb (127 kg)   LMP 02/10/2016 (Exact Date) Comment: preg test neg  SpO2 98%   BMI 41.35 kg/m  General:   Alert,  pleasant and cooperative in NAD Head:  Normocephalic and atraumatic. Neck:  Supple; no masses or thyromegaly. Lungs:  Clear throughout to auscultation.    Heart:  Regular rate and rhythm. Abdomen:  Soft, nontender and nondistended. Normal bowel sounds, without guarding, and without rebound.   Neurologic:  Alert and  oriented x4;  grossly normal neurologically.  Impression/Plan: Rachael Gonzales is here for an endoscopy and colonoscopy to be performed for GERD diverticulitis  Risks, benefits, limitations, and alternatives regarding  endoscopy and colonoscopy have been  reviewed with the patient.  Questions have been answered.  All parties agreeable.   Lucilla Lame, MD  03/03/2016, 11:26 AM

## 2016-03-03 NOTE — Anesthesia Procedure Notes (Signed)
Procedure Name: MAC Performed by: Alissandra Geoffroy Pre-anesthesia Checklist: Patient identified, Emergency Drugs available, Suction available, Timeout performed and Patient being monitored Patient Re-evaluated:Patient Re-evaluated prior to inductionOxygen Delivery Method: Nasal cannula Placement Confirmation: positive ETCO2       

## 2016-03-03 NOTE — Transfer of Care (Signed)
Immediate Anesthesia Transfer of Care Note  Patient: Rachael Gonzales  Procedure(s) Performed: Procedure(s) with comments: COLONOSCOPY WITH PROPOFOL (N/A) ESOPHAGOGASTRODUODENOSCOPY (EGD) WITH PROPOFOL (N/A) POLYPECTOMY - gastric biopsy Descending colon polyp.   Patient Location: PACU  Anesthesia Type: MAC  Level of Consciousness: awake, alert  and patient cooperative  Airway and Oxygen Therapy: Patient Spontanous Breathing and Patient connected to supplemental oxygen  Post-op Assessment: Post-op Vital signs reviewed, Patient's Cardiovascular Status Stable, Respiratory Function Stable, Patent Airway and No signs of Nausea or vomiting  Post-op Vital Signs: Reviewed and stable  Complications: No apparent anesthesia complications

## 2016-03-03 NOTE — Op Note (Signed)
[facility] Gastroenterology Patient Name: Rachael Gonzales Procedure Date: 03/03/2016 11:28 AM MRN: [id number] Account #: [id number] Date of Birth: 1965/11/13 Admit Type: Outpatient Age: 50 Room: [facility] OR ROOM 01 Gender: Female Note Status: Finalized Procedure:            Colonoscopy Indications:          Follow-up of diverticulitis Providers:            Lucilla Lame MD, MD Referring MD:         [name], MD (Referring MD) Medicines:            Propofol per Anesthesia Complications:        No immediate complications. Procedure:            Pre-Anesthesia Assessment:                       - Prior to the procedure, a History and Physical was                        performed, and patient medications and allergies were                        reviewed. The patient's tolerance of previous                        anesthesia was also reviewed. The risks and benefits of                        the procedure and the sedation options and risks were                        discussed with the patient. All questions were                        answered, and informed consent was obtained. Prior                        Anticoagulants: The patient has taken no previous                        anticoagulant or antiplatelet agents. ASA Grade                        Assessment: II - A patient with mild systemic disease.                        After reviewing the risks and benefits, the patient was                        deemed in satisfactory condition to undergo the                        procedure.                       After obtaining informed consent, the colonoscope was                        passed under direct vision. Throughout the procedure,  the patient's blood pressure, pulse, and oxygen                        saturations were monitored continuously. The was                        introduced through the anus and advanced to the the     cecum, identified by appendiceal orifice and ileocecal                        valve. The colonoscopy was performed without                        difficulty. The patient tolerated the procedure well.                        The quality of the bowel preparation was excellent. Findings:      The perianal and digital rectal examinations were normal.      A 5 mm polyp was found in the descending colon. The polyp was sessile.       The polyp was removed with a cold snare. Resection and retrieval were       complete.      Multiple small-mouthed diverticula were found in the sigmoid colon.      Non-bleeding internal hemorrhoids were found during retroflexion. The       hemorrhoids were Grade II (internal hemorrhoids that prolapse but reduce       spontaneously). Impression:           - One 5 mm polyp in the descending colon, removed with                        a cold snare. Resected and retrieved.                       - Diverticulosis in the sigmoid colon.                       - Non-bleeding internal hemorrhoids. Recommendation:       - Discharge patient to home.                       - Resume previous diet.                       - Continue present medications. Procedure Code(s):    --- Professional ---                       [phone], Colonoscopy, flexible; with removal of tumor(s),                        polyp(s), or other lesion(s) by snare technique Diagnosis Code(s):    --- Professional ---                       K57.32, Diverticulitis of large intestine without                        perforation or abscess without bleeding                       D12.4,  Benign neoplasm of descending colon CPT copyright 2016 American Medical Association. All rights reserved. The codes documented in this report are preliminary and upon coder review may  be revised to meet current compliance requirements. Lucilla Lame MD, MD 03/03/2016 11:56:04 AM This report has been signed electronically. Number of Addenda:  0 Note Initiated On: 03/03/2016 11:28 AM Scope Withdrawal Time: 0 hours 7 minutes 53 seconds  Total Procedure Duration: 0 hours 9 minutes 18 seconds       [facility]

## 2016-03-03 NOTE — Op Note (Signed)
[facility] Gastroenterology Patient Name: Rachael Gonzales Procedure Date: 03/03/2016 11:29 AM MRN: [id number] Account #: [id number] Date of Birth: 1965-10-01 Admit Type: Outpatient Age: 50 Room: [facility] OR ROOM 01 Gender: Female Note Status: Finalized Procedure:            Upper GI endoscopy Indications:          Heartburn Providers:            Lucilla Lame MD, MD Referring MD:         [name], MD (Referring MD) Medicines:            Propofol per Anesthesia Complications:        No immediate complications. Procedure:            Pre-Anesthesia Assessment:                       - Prior to the procedure, a History and Physical was                        performed, and patient medications and allergies were                        reviewed. The patient's tolerance of previous                        anesthesia was also reviewed. The risks and benefits of                        the procedure and the sedation options and risks were                        discussed with the patient. All questions were                        answered, and informed consent was obtained. Prior                        Anticoagulants: The patient has taken no previous                        anticoagulant or antiplatelet agents. ASA Grade                        Assessment: II - A patient with mild systemic disease.                        After reviewing the risks and benefits, the patient was                        deemed in satisfactory condition to undergo the                        procedure.                       After obtaining informed consent, the endoscope was                        passed under direct vision. Throughout the procedure,  the patient's blood pressure, pulse, and oxygen                        saturations were monitored continuously. The Olympus                        GIF H180J colonscope [id number] was introduced                        through  the mouth, and advanced to the second part of                        duodenum. The upper GI endoscopy was accomplished                        without difficulty. The patient tolerated the procedure                        well. Findings:      LA Grade B (one or more mucosal breaks greater than 5 mm, not extending       between the tops of two mucosal folds) esophagitis with no bleeding was       found at the gastroesophageal junction.      Scattered moderate inflammation characterized by erosions was found in       the stomach. Biopsies were taken with a cold forceps for histology.      The examined duodenum was normal. Impression:           - LA Grade B reflux esophagitis.                       - Gastritis. Biopsied.                       - Normal examined duodenum. Recommendation:       - Discharge patient to home.                       - Resume previous diet.                       - Continue present medications.                       - Await pathology results.                       - Perform a colonoscopy today. Procedure Code(s):    --- Professional ---                       [phone], Esophagogastroduodenoscopy, flexible, transoral;                        with biopsy, single or multiple Diagnosis Code(s):    --- Professional ---                       R12, Heartburn                       K21.0, Gastro-esophageal reflux disease with esophagitis  K29.70, Gastritis, unspecified, without bleeding CPT copyright 2016 American Medical Association. All rights reserved. The codes documented in this report are preliminary and upon coder review may  be revised to meet current compliance requirements. Lucilla Lame MD, MD 03/03/2016 11:42:42 AM This report has been signed electronically. Number of Addenda: 0 Note Initiated On: 03/03/2016 11:29 AM      [facility]

## 2016-03-03 NOTE — Anesthesia Postprocedure Evaluation (Signed)
Anesthesia Post Note  Patient: Rachael Gonzales  Procedure(s) Performed: Procedure(s) (LRB): COLONOSCOPY WITH PROPOFOL (N/A) ESOPHAGOGASTRODUODENOSCOPY (EGD) WITH PROPOFOL (N/A) POLYPECTOMY  Patient location during evaluation: PACU Anesthesia Type: MAC Level of consciousness: awake and alert and oriented Pain management: satisfactory to patient Vital Signs Assessment: post-procedure vital signs reviewed and stable Respiratory status: spontaneous breathing, nonlabored ventilation and respiratory function stable Cardiovascular status: blood pressure returned to baseline and stable Postop Assessment: Adequate PO intake and No signs of nausea or vomiting Anesthetic complications: no    Raliegh Ip

## 2016-03-03 NOTE — Anesthesia Preprocedure Evaluation (Signed)
Anesthesia Evaluation  Patient identified by MRN, date of birth, ID band Patient awake    Reviewed: Allergy & Precautions, H&P , NPO status , Patient's Chart, lab work & pertinent test results  Airway Mallampati: II  TM Distance: >3 FB Neck ROM: full    Dental no notable dental hx.    Pulmonary former smoker,    Pulmonary exam normal        Cardiovascular Normal cardiovascular exam     Neuro/Psych    GI/Hepatic   Endo/Other    Renal/GU      Musculoskeletal   Abdominal   Peds  Hematology   Anesthesia Other Findings   Reproductive/Obstetrics                             Anesthesia Physical Anesthesia Plan  ASA: II  Anesthesia Plan: MAC   Post-op Pain Management:    Induction:   Airway Management Planned:   Additional Equipment:   Intra-op Plan:   Post-operative Plan:   Informed Consent: I have reviewed the patients History and Physical, chart, labs and discussed the procedure including the risks, benefits and alternatives for the proposed anesthesia with the patient or authorized representative who has indicated his/her understanding and acceptance.     Plan Discussed with:   Anesthesia Plan Comments:         Anesthesia Quick Evaluation  

## 2016-03-06 ENCOUNTER — Encounter: Payer: Self-pay | Admitting: Gastroenterology

## 2016-03-07 ENCOUNTER — Encounter: Payer: Self-pay | Admitting: Gastroenterology

## 2016-03-14 ENCOUNTER — Encounter: Payer: Self-pay | Admitting: Gastroenterology

## 2016-05-18 ENCOUNTER — Ambulatory Visit
Admission: RE | Admit: 2016-05-18 | Discharge: 2016-05-18 | Disposition: A | Discharge status: Home / Self Care | Payer: BLUE CROSS/BLUE SHIELD | Source: Ambulatory Visit | Attending: Family Medicine | Admitting: Family Medicine

## 2016-05-18 DIAGNOSIS — Z1231 Encounter for screening mammogram for malignant neoplasm of breast: Secondary | ICD-10-CM | POA: Diagnosis not present

## 2016-07-02 ENCOUNTER — Ambulatory Visit
Admission: EM | Admit: 2016-07-02 | Discharge: 2016-07-02 | Disposition: A | Discharge status: Home / Self Care | Payer: BLUE CROSS/BLUE SHIELD | Attending: Family Medicine | Admitting: Family Medicine

## 2016-07-02 ENCOUNTER — Encounter: Payer: Self-pay | Admitting: Emergency Medicine

## 2016-07-02 DIAGNOSIS — J069 Acute upper respiratory infection, unspecified: Secondary | ICD-10-CM | POA: Diagnosis not present

## 2016-07-02 MED ORDER — FLUTICASONE PROPIONATE 50 MCG/ACT NA SUSP: 2.0000 | Freq: Every day | NASAL | 0 refills | Status: DC

## 2016-07-02 MED ORDER — HYDROCOD POLST-CPM POLST ER 10-8 MG/5ML PO SUER: 5.0000 mL | Freq: Two times a day (BID) | ORAL | 0 refills | Status: DC

## 2016-07-02 MED ORDER — BENZONATATE 200 MG PO CAPS: 200.0000 mg | ORAL_CAPSULE | Freq: Three times a day (TID) | ORAL | 0 refills | Status: DC

## 2016-07-02 NOTE — ED Triage Notes (Signed)
Per patient cough / congestion / and headache x 3 days.

## 2016-07-02 NOTE — ED Provider Notes (Signed)
CSN: [id number]     Arrival date & time 07/02/16  0802 History   First MD Initiated Contact with Patient 07/02/16 0830     Chief Complaint  Patient presents with  . Cough   (Consider location/radiation/quality/duration/timing/severity/associated sxs/prior Treatment) HPI  51 year old female who presents with a three-day history of cough congestion and headache. She states that her wife recently was diagnosed with pneumonia. She denies any fever or chills. States that she is coughing a great deal nighttime.      Past Medical History:  Diagnosis Date  . Allergy   . Depression   . Diverticulitis   . Headache    sinus  . Irritable bowel    Past Surgical History:  Procedure Laterality Date  . BACK SURGERY  04/17/1990   L5-S1  . CESAREAN SECTION    . COLONOSCOPY WITH PROPOFOL N/A 03/03/2016   Procedure: COLONOSCOPY WITH PROPOFOL;  Surgeon: Lucilla Lame, MD;  Location: [place];  Service: Endoscopy;  Laterality: N/A;  . ESOPHAGOGASTRODUODENOSCOPY (EGD) WITH PROPOFOL N/A 03/03/2016   Procedure: ESOPHAGOGASTRODUODENOSCOPY (EGD) WITH PROPOFOL;  Surgeon: Lucilla Lame, MD;  Location: [place];  Service: Endoscopy;  Laterality: N/A;  . POLYPECTOMY  03/03/2016   Procedure: POLYPECTOMY;  Surgeon: Lucilla Lame, MD;  Location: [place];  Service: Endoscopy;;  gastric biopsy Descending colon polyp.    Family History  Problem Relation Age of Onset  . COPD Mother   . Asthma Mother   . Cancer Father   . Sudden death Neg Hx   . Hypertension Neg Hx   . Heart attack Neg Hx   . Diabetes Neg Hx   . Hyperlipidemia Neg Hx   . Breast cancer Neg Hx    Social History  Substance Use Topics  . Smoking status: Former Smoker    Quit date: 04/18/2011  . Smokeless tobacco: Never Used  . Alcohol use 1.8 oz/week    3 Shots of liquor per week   OB History    No data available     Review of Systems  Constitutional: Positive for activity change, chills, fatigue and  fever.  HENT: Positive for congestion, sinus pain, sinus pressure and voice change.   Respiratory: Positive for cough. Negative for shortness of breath, wheezing and stridor.   All other systems reviewed and are negative.   Allergies  Iodinated diagnostic agents  Home Medications   Prior to Admission medications   Medication Sig Start Date End Date Taking? Authorizing Provider  calcium carbonate (TUMS - DOSED IN MG ELEMENTAL CALCIUM) 500 MG chewable tablet Chew 1 tablet by mouth as needed for indigestion or heartburn.   Yes Historical Provider, MD  dicyclomine (BENTYL) 10 MG capsule TAKE 1 CAPSULE BY MOUTH 3 TIMES A DAY 09/14/15  Yes [name], MD  loratadine (CLARITIN) 10 MG tablet Take 1 tablet by mouth 1 day or 1 dose.   Yes Historical Provider, MD  Multiple Vitamins-Minerals (MULTIVITAMIN & MINERAL) LIQD Take 1 tablet by mouth 1 day or 1 dose. pill   Yes Historical Provider, MD  pantoprazole (PROTONIX) 40 MG tablet Take 1 tablet (40 mg total) by mouth daily. 03/03/16  Yes Lucilla Lame, MD  Probiotic Product (PROBIOTIC DAILY PO) Take by mouth.   Yes Historical Provider, MD  benzonatate (TESSALON) 200 MG capsule Take 1 capsule (200 mg total) by mouth every 8 (eight) hours. 07/02/16   Lorin Picket, PA-C  chlorpheniramine-HYDROcodone (TUSSIONEX PENNKINETIC ER) 10-8 MG/5ML SUER Take 5 mLs by mouth 2 (  two) times daily. 07/02/16   Lorin Picket, PA-C  fluticasone (FLONASE) 50 MCG/ACT nasal spray Place 2 sprays into both nostrils daily. 07/02/16   Lorin Picket, PA-C   Meds Ordered and Administered this Visit  Medications - No data to display  BP 108/60 (BP Location: Left Arm)   Pulse 75   Temp 97.9 F (36.6 C) (Oral)   Resp 18   Ht 5\' 9"  (1.753 m)   Wt 286 lb (129.7 kg)   LMP 06/18/2016   SpO2 99%   BMI 42.23 kg/m  No data found.   Physical Exam  Constitutional: She is oriented to person, place, and time. She appears well-developed and well-nourished. No distress.   HENT:  Head: Normocephalic and atraumatic.  Right Ear: External ear normal.  Left Ear: External ear normal.  Nose: Nose normal.  Mouth/Throat: Oropharynx is clear and moist. No oropharyngeal exudate.  Eyes: Pupils are equal, round, and reactive to light. Right eye exhibits no discharge. Left eye exhibits no discharge.  Neck: Normal range of motion.  Pulmonary/Chest: Effort normal and breath sounds normal. No respiratory distress. She has no wheezes. She has no rales.  Musculoskeletal: Normal range of motion.  Lymphadenopathy:    She has no cervical adenopathy.  Neurological: She is alert and oriented to person, place, and time.  Skin: Skin is warm and dry. She is not diaphoretic.  Psychiatric: She has a normal mood and affect. Her behavior is normal. Judgment and thought content normal.  Nursing note and vitals reviewed.   Urgent Care Course     Procedures (including critical care time)  Labs Review Labs Reviewed - No data to display  Imaging Review No results found.   Visual Acuity Review  Right Eye Distance:   Left Eye Distance:   Bilateral Distance:    Right Eye Near:   Left Eye Near:    Bilateral Near:         MDM   1. Acute upper respiratory infection    Discharge Medication List as of 07/02/2016  8:45 AM    START taking these medications   Details  benzonatate (TESSALON) 200 MG capsule Take 1 capsule (200 mg total) by mouth every 8 (eight) hours., Starting Sun 07/02/2016, Normal    chlorpheniramine-HYDROcodone (TUSSIONEX PENNKINETIC ER) 10-8 MG/5ML SUER Take 5 mLs by mouth 2 (two) times daily., Starting Sun 07/02/2016, Print    fluticasone (FLONASE) 50 MCG/ACT nasal spray Place 2 sprays into both nostrils daily., Starting Sun 07/02/2016, Normal      Plan: 1. Test/x-ray results and diagnosis reviewed with patient 2. rx as per orders; risks, benefits, potential side effects reviewed with patient 3. Recommend supportive treatment with Rest and fluids.  Use Flonase for drainage for 3-4 weeks. After this is a viral illness and does not require antibiotics at this time. She was cautioned regarding use of Tussionex with activities requiring concentration or judgment. And never drive while taking the Tussionex. If she is not improving she should follow-up with her primary care physician Dr. Ronnald Ramp 4. F/u prn if symptoms worsen or don't improve     Lorin Picket, PA-C 07/02/16 1101

## 2016-07-10 DIAGNOSIS — D485 Neoplasm of uncertain behavior of skin: Secondary | ICD-10-CM | POA: Diagnosis not present

## 2016-07-10 DIAGNOSIS — L2089 Other atopic dermatitis: Secondary | ICD-10-CM | POA: Diagnosis not present

## 2016-07-19 ENCOUNTER — Encounter: Payer: Self-pay | Admitting: Family Medicine

## 2016-07-19 ENCOUNTER — Ambulatory Visit (INDEPENDENT_AMBULATORY_CARE_PROVIDER_SITE_OTHER): Payer: BLUE CROSS/BLUE SHIELD | Admitting: Family Medicine

## 2016-07-19 VITALS — BP 130/80 | HR 60 | Ht 69.0 in | Wt 290.0 lb

## 2016-07-19 DIAGNOSIS — K589 Irritable bowel syndrome without diarrhea: Secondary | ICD-10-CM

## 2016-07-19 DIAGNOSIS — Z23 Encounter for immunization: Secondary | ICD-10-CM | POA: Diagnosis not present

## 2016-07-19 DIAGNOSIS — T148XXA Other injury of unspecified body region, initial encounter: Secondary | ICD-10-CM | POA: Diagnosis not present

## 2016-07-19 DIAGNOSIS — E785 Hyperlipidemia, unspecified: Secondary | ICD-10-CM | POA: Diagnosis not present

## 2016-07-19 DIAGNOSIS — R69 Illness, unspecified: Secondary | ICD-10-CM | POA: Diagnosis not present

## 2016-07-19 DIAGNOSIS — Z6841 Body Mass Index (BMI) 40.0 and over, adult: Secondary | ICD-10-CM

## 2016-07-19 DIAGNOSIS — K2900 Acute gastritis without bleeding: Secondary | ICD-10-CM

## 2016-07-19 DIAGNOSIS — R635 Abnormal weight gain: Secondary | ICD-10-CM | POA: Diagnosis not present

## 2016-07-19 NOTE — Progress Notes (Signed)
Name: Rachael Gonzales   MRN: [id number]    DOB: 1966-02-06   Date:07/19/2016       Progress Note  Subjective  Chief Complaint  Chief Complaint  Patient presents with  . lab work    renal, lipid , glucose  . Irritable Bowel Syndrome    refill Bentyl    Gastroesophageal Reflux  She complains of abdominal pain and heartburn. She reports no belching, no chest pain, no choking, no coughing, no dysphagia, no early satiety, no hoarse voice, no nausea, no sore throat, no stridor, no water brash or no wheezing. This is a chronic problem. The current episode started more than 1 year ago. The problem occurs rarely. The problem has been gradually improving. The symptoms are aggravated by certain foods. Pertinent negatives include no anemia, fatigue, melena, muscle weakness, orthopnea or weight loss. diverticulosis. Risk factors: PUD. She has tried a PPI for the symptoms. The treatment provided mild relief. Past procedures include an EGD. colonscopy.  Hyperlipidemia  This is a chronic problem. The current episode started more than 1 year ago. The problem is controlled. Recent lipid tests were reviewed and are normal. Exacerbating diseases include obesity. She has no history of chronic renal disease, diabetes, hypothyroidism or liver disease. Pertinent negatives include no chest pain, focal sensory loss, focal weakness, leg pain, myalgias or shortness of breath. Current antihyperlipidemic treatment includes diet change. The current treatment provides mild improvement of lipids.  Back Pain  This is a chronic problem. The current episode started more than 1 year ago. The problem has been waxing and waning since onset. The quality of the pain is described as aching. The symptoms are aggravated by bending and twisting. Associated symptoms include abdominal pain. Pertinent negatives include no chest pain, dysuria, fever, headaches, leg pain, paresis, paresthesias, tingling or weight loss. She has tried NSAIDs for the  symptoms.  Hand Injury   The incident occurred more than 1 week ago. Injury mechanism: lawn motor. The pain is present in the right fingers. The quality of the pain is described as aching. Pertinent negatives include no chest pain or tingling.    No problem-specific Assessment & Plan notes found for this encounter.   Past Medical History:  Diagnosis Date  . Allergy   . Depression   . Diverticulitis   . Headache    sinus  . Irritable bowel     Past Surgical History:  Procedure Laterality Date  . BACK SURGERY  04/17/1990   L5-S1  . CESAREAN SECTION    . COLONOSCOPY WITH PROPOFOL N/A 03/03/2016   Procedure: COLONOSCOPY WITH PROPOFOL;  Surgeon: Lucilla Lame, MD;  Location: [place];  Service: Endoscopy;  Laterality: N/A;  . ESOPHAGOGASTRODUODENOSCOPY (EGD) WITH PROPOFOL N/A 03/03/2016   Procedure: ESOPHAGOGASTRODUODENOSCOPY (EGD) WITH PROPOFOL;  Surgeon: Lucilla Lame, MD;  Location: [place];  Service: Endoscopy;  Laterality: N/A;  . POLYPECTOMY  03/03/2016   Procedure: POLYPECTOMY;  Surgeon: Lucilla Lame, MD;  Location: [place];  Service: Endoscopy;;  gastric biopsy Descending colon polyp.     Family History  Problem Relation Age of Onset  . COPD Mother   . Asthma Mother   . Cancer Father   . Sudden death Neg Hx   . Hypertension Neg Hx   . Heart attack Neg Hx   . Diabetes Neg Hx   . Hyperlipidemia Neg Hx   . Breast cancer Neg Hx     Social History   Social History  . Marital status:  Married    Spouse name: N/A  . Number of children: N/A  . Years of education: N/A   Occupational History  . Not on file.   Social History Main Topics  . Smoking status: Former Smoker    Quit date: 04/18/2011  . Smokeless tobacco: Never Used  . Alcohol use 1.8 oz/week    3 Shots of liquor per week  . Drug use: No  . Sexual activity: Yes   Other Topics Concern  . Not on file   Social History Narrative  . No narrative on file    Allergies   Allergen Reactions  . Iodinated Diagnostic Agents Hives    CT A/P w on 01/10/16. Itching and hives.    Outpatient Medications Prior to Visit  Medication Sig Dispense Refill  . dicyclomine (BENTYL) 10 MG capsule TAKE 1 CAPSULE BY MOUTH 3 TIMES A DAY 45 capsule 0  . fluticasone (FLONASE) 50 MCG/ACT nasal spray Place 2 sprays into both nostrils daily. 16 g 0  . loratadine (CLARITIN) 10 MG tablet Take 1 tablet by mouth 1 day or 1 dose.    . Multiple Vitamins-Minerals (MULTIVITAMIN & MINERAL) LIQD Take 1 tablet by mouth 1 day or 1 dose. pill    . pantoprazole (PROTONIX) 40 MG tablet Take 1 tablet (40 mg total) by mouth daily. 30 tablet 6  . Probiotic Product (PROBIOTIC DAILY PO) Take by mouth.    . calcium carbonate (TUMS - DOSED IN MG ELEMENTAL CALCIUM) 500 MG chewable tablet Chew 1 tablet by mouth as needed for indigestion or heartburn.    . benzonatate (TESSALON) 200 MG capsule Take 1 capsule (200 mg total) by mouth every 8 (eight) hours. 30 capsule 0  . chlorpheniramine-HYDROcodone (TUSSIONEX PENNKINETIC ER) 10-8 MG/5ML SUER Take 5 mLs by mouth 2 (two) times daily. 115 mL 0   No facility-administered medications prior to visit.     Review of Systems  Constitutional: Negative for chills, fatigue, fever, malaise/fatigue and weight loss.  HENT: Positive for congestion. Negative for ear discharge, ear pain, hoarse voice and sore throat.   Eyes: Negative for blurred vision.  Respiratory: Negative for cough, sputum production, choking, shortness of breath and wheezing.   Cardiovascular: Negative for chest pain, palpitations and leg swelling.  Gastrointestinal: Positive for abdominal pain and heartburn. Negative for blood in stool, constipation, diarrhea, dysphagia, melena and nausea.  Genitourinary: Negative for dysuria, frequency, hematuria and urgency.  Musculoskeletal: Positive for back pain. Negative for joint pain, myalgias, muscle weakness and neck pain.  Skin: Negative for rash.   Neurological: Negative for dizziness, tingling, sensory change, focal weakness, headaches and paresthesias.  Endo/Heme/Allergies: Negative for environmental allergies and polydipsia. Does not bruise/bleed easily.  Psychiatric/Behavioral: Negative for depression and suicidal ideas. The patient is not nervous/anxious and does not have insomnia.      Objective  Vitals:   07/19/16 0903  BP: 130/80  Pulse: 60  Weight: 290 lb (131.5 kg)  Height: 5\' 9"  (1.753 m)    Physical Exam  Constitutional: She is well-developed, well-nourished, and in no distress. No distress.  HENT:  Head: Normocephalic and atraumatic.  Right Ear: External ear normal.  Left Ear: External ear normal.  Nose: Mucosal edema present.  Mouth/Throat: Oropharynx is clear and moist. No posterior oropharyngeal edema or posterior oropharyngeal erythema.  Eyes: Conjunctivae and EOM are normal. Pupils are equal, round, and reactive to light. Right eye exhibits no discharge. Left eye exhibits no discharge.  Neck: Normal range of motion. Neck supple.  No JVD present. No thyromegaly present.  Cardiovascular: Normal rate, regular rhythm, normal heart sounds and intact distal pulses.  Exam reveals no gallop and no friction rub.   No murmur heard. Pulmonary/Chest: Effort normal and breath sounds normal. She has no wheezes. She has no rales.  Abdominal: Soft. Bowel sounds are normal. She exhibits no mass. There is no tenderness. There is no guarding.  Musculoskeletal: Normal range of motion. She exhibits no edema.       Lumbar back: She exhibits spasm.  Lymphadenopathy:    She has no cervical adenopathy.  Neurological: She is alert.  Skin: Skin is warm and dry. She is not diaphoretic.  Psychiatric: Mood and affect normal.  Nursing note and vitals reviewed.     Assessment & Plan  Problem List Items Addressed This Visit      Digestive   Acute gastritis without hemorrhage - Primary   Relevant Orders   CBC w/Diff/Platelet    Irritable bowel syndrome     Other   BMI 40.0-44.9, adult (HCC)   Relevant Orders   TSH   Lipid Profile    Other Visit Diagnoses    Hyperlipidemia, unspecified hyperlipidemia type       Relevant Orders   Lipid Profile   Taking medication for chronic disease       nsaid   Relevant Orders   Renal Function Panel   Weight gain       Relevant Orders   TSH   Need for diphtheria-tetanus-pertussis (Tdap) vaccine       Relevant Orders   Tdap vaccine greater than or equal to 7yo IM (Completed)   Puncture wound       Relevant Orders   Tdap vaccine greater than or equal to 7yo IM (Completed)      No orders of the defined types were placed in this encounter.     Dr. [facility]  07/19/16

## 2016-07-20 LAB — CBC WITH DIFFERENTIAL/PLATELET
Basophils Absolute: 0 10*3/uL (ref 0.0–0.2)
Basos: 0 %
EOS (ABSOLUTE): 0.1 10*3/uL (ref 0.0–0.4)
EOS: 2 %
HEMATOCRIT: 40.2 % (ref 34.0–46.6)
HEMOGLOBIN: 12.8 g/dL (ref 11.1–15.9)
IMMATURE GRANS (ABS): 0 10*3/uL (ref 0.0–0.1)
Immature Granulocytes: 0 %
LYMPHS ABS: 1.4 10*3/uL (ref 0.7–3.1)
LYMPHS: 23 %
MCH: 29.3 pg (ref 26.6–33.0)
MCHC: 31.8 g/dL (ref 31.5–35.7)
MCV: 92 fL (ref 79–97)
MONOCYTES: 10 %
Monocytes Absolute: 0.6 10*3/uL (ref 0.1–0.9)
Neutrophils Absolute: 4 10*3/uL (ref 1.4–7.0)
Neutrophils: 65 %
Platelets: 302 10*3/uL (ref 150–379)
RBC: 4.37 x10E6/uL (ref 3.77–5.28)
RDW: 13.1 % (ref 12.3–15.4)
WBC: 6.1 10*3/uL (ref 3.4–10.8)

## 2016-07-20 LAB — RENAL FUNCTION PANEL
ALBUMIN: 4.1 g/dL (ref 3.5–5.5)
BUN / CREAT RATIO: 19 (ref 9–23)
BUN: 10 mg/dL (ref 6–24)
CO2: 25 mmol/L (ref 18–29)
CREATININE: 0.52 mg/dL — AB (ref 0.57–1.00)
Calcium: 8.9 mg/dL (ref 8.7–10.2)
Chloride: 102 mmol/L (ref 96–106)
GFR calc non Af Amer: 113 mL/min/{1.73_m2} (ref >59)
GFR, EST AFRICAN AMERICAN: 131 mL/min/{1.73_m2} (ref >59)
Glucose: 83 mg/dL (ref 65–99)
Phosphorus: 2.6 mg/dL (ref 2.5–4.5)
Potassium: 4.5 mmol/L (ref 3.5–5.2)
Sodium: 142 mmol/L (ref 134–144)

## 2016-07-20 LAB — LIPID PANEL
CHOLESTEROL TOTAL: 220 mg/dL — AB (ref 100–199)
Chol/HDL Ratio: 4.9 ratio — ABNORMAL HIGH (ref 0.0–4.4)
HDL: 45 mg/dL (ref >39)
LDL CALC: 150 mg/dL — AB (ref 0–99)
TRIGLYCERIDES: 123 mg/dL (ref 0–149)
VLDL CHOLESTEROL CAL: 25 mg/dL (ref 5–40)

## 2016-07-20 LAB — TSH: TSH: 0.992 u[IU]/mL (ref 0.450–4.500)

## 2016-07-24 DIAGNOSIS — D229 Melanocytic nevi, unspecified: Secondary | ICD-10-CM | POA: Diagnosis not present

## 2016-07-24 DIAGNOSIS — D485 Neoplasm of uncertain behavior of skin: Secondary | ICD-10-CM | POA: Diagnosis not present

## 2016-10-25 ENCOUNTER — Telehealth: Payer: Self-pay | Admitting: Gastroenterology

## 2016-10-25 NOTE — Telephone Encounter (Signed)
*  STAT* If patient is at the pharmacy, call can be transferred to refill team.   1. Which medications need to be refilled? (please list name of each medication and dose if known) pantoprazole (PROTONIX) 40 MG tablet  2. Which pharmacy/location (including street and city if local pharmacy) is medication to be sent to? [place]   3. Do they need a 30 day or 90 day supply? 90 day

## 2016-10-30 ENCOUNTER — Other Ambulatory Visit: Payer: Self-pay

## 2016-10-30 MED ORDER — PANTOPRAZOLE SODIUM 40 MG PO TBEC: 40.0000 mg | DELAYED_RELEASE_TABLET | Freq: Every day | ORAL | 3 refills | Status: DC

## 2016-10-30 NOTE — Telephone Encounter (Signed)
Refill for protonix sent to [facility] per pt request.

## 2016-10-30 NOTE — Telephone Encounter (Signed)
Don't know what this is about?

## 2016-11-07 ENCOUNTER — Other Ambulatory Visit: Payer: Self-pay | Admitting: Gastroenterology

## 2016-12-24 ENCOUNTER — Encounter: Payer: Self-pay | Admitting: Gynecology

## 2016-12-24 ENCOUNTER — Ambulatory Visit
Admission: EM | Admit: 2016-12-24 | Discharge: 2016-12-24 | Disposition: A | Discharge status: Home / Self Care | Payer: BLUE CROSS/BLUE SHIELD | Attending: Family Medicine | Admitting: Family Medicine

## 2016-12-24 DIAGNOSIS — M545 Low back pain: Secondary | ICD-10-CM | POA: Diagnosis not present

## 2016-12-24 DIAGNOSIS — M62830 Muscle spasm of back: Secondary | ICD-10-CM | POA: Diagnosis not present

## 2016-12-24 DIAGNOSIS — T148XXA Other injury of unspecified body region, initial encounter: Secondary | ICD-10-CM

## 2016-12-24 DIAGNOSIS — M5441 Lumbago with sciatica, right side: Secondary | ICD-10-CM

## 2016-12-24 MED ORDER — METAXALONE 800 MG PO TABS: 800.0000 mg | ORAL_TABLET | Freq: Three times a day (TID) | ORAL | 0 refills | Status: DC

## 2016-12-24 MED ORDER — KETOROLAC TROMETHAMINE 60 MG/2ML IM SOLN
60.0000 mg | Freq: Once | INTRAMUSCULAR | Status: AC
  Administered 2016-12-24: 60 mg via INTRAMUSCULAR

## 2016-12-24 MED ORDER — CELECOXIB 400 MG PO CAPS: 400.0000 mg | ORAL_CAPSULE | Freq: Every day | ORAL | 0 refills | Status: DC

## 2016-12-24 MED ORDER — TRAMADOL HCL 50 MG PO TABS: 50.0000 mg | ORAL_TABLET | Freq: Four times a day (QID) | ORAL | 0 refills | Status: DC | PRN

## 2016-12-24 NOTE — ED Triage Notes (Signed)
Per patient had back surgery in 1991. Patient stated occasionally back pain, however x yesterday was the worse of her pain.

## 2016-12-24 NOTE — ED Provider Notes (Signed)
[facility]    CSN: [id number] Arrival date & time: 12/24/16  9702     History   Chief Complaint Chief Complaint  Patient presents with  . Back Pain    HPI Rachael Gonzales is a 51 y.o. female.   Patient is a 51 year old white female with low back pain. She has had back pain before and had back surgery in 1999 but did not have fusion at the time. Significant amount of the vertebra she states was removed at that time. Since then she's only had a baby 2 other exacerbations of back pain. This time she was picking up her cat when it hit her. 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 Pain is 10 out of 10 now. Along the back pain she has pain going down her right side of her leg as well. It hurts when she sits hurts when she stands etc. History of diverticulitis depression and allergies and headaches and IBS. She also has diagnoses of obesity. She was diagnosed with ulcer disease last year. She's had back surgery colonoscopy upper endoscopy polypectomy and cesarean section. No pertinent family medical history. He is a former smoker and she is allergic to iodine dye.   The history is provided by the patient and a significant other. No language interpreter was used.  Back Pain  Location:  Lumbar spine and sacro-iliac joint Quality:  Burning, shooting, stabbing and stiffness Radiates to:  L thigh and L posterior upper leg Pain severity:  Severe Pain is:  Same all the time Onset quality:  Sudden Timing:  Constant Progression:  Worsening Chronicity:  New Context: twisting   Context: not emotional stress, not falling, not jumping from heights, not lifting heavy objects, not MCA, not MVA, not occupational injury, not pedestrian accident, not physical stress, not recent illness and not recent injury   Relieved by:  Nothing Worsened by:  Movement, twisting, standing, sitting and lying down Ineffective treatments:  None  tried Associated symptoms: leg pain and numbness     Past Medical History:  Diagnosis Date  . Allergy   . Depression   . Diverticulitis   . Headache    sinus  . Irritable bowel     Patient Active Problem List   Diagnosis Date Noted  . BMI 40.0-44.9, adult ([place]) 07/19/2016  . Heartburn   . Reflux esophagitis   . Acute gastritis without hemorrhage   . Diverticulitis of colon (without mention of hemorrhage)(562.11)   . Benign neoplasm of descending colon   . Depression 12/30/2014  . Irritable bowel syndrome 08/14/2014  . Adult BMI 30+ 08/14/2014  . Acute situational disturbance 08/14/2014  . Lateral epicondylitis of left elbow 07/04/2013  . Heel pain, bilateral 05/26/2013    Past Surgical History:  Procedure Laterality Date  . BACK SURGERY  04/17/1990   L5-S1  . CESAREAN SECTION    . COLONOSCOPY WITH PROPOFOL N/A 03/03/2016   Procedure: COLONOSCOPY WITH PROPOFOL;  Surgeon: Lucilla Lame, MD;  Location: [place];  Service: Endoscopy;  Laterality: N/A;  . ESOPHAGOGASTRODUODENOSCOPY (EGD) WITH PROPOFOL N/A 03/03/2016   Procedure: ESOPHAGOGASTRODUODENOSCOPY (EGD) WITH PROPOFOL;  Surgeon: Lucilla Lame, MD;  Location: [place];  Service: Endoscopy;  Laterality: N/A;  . POLYPECTOMY  03/03/2016   Procedure: POLYPECTOMY;  Surgeon: Lucilla Lame, MD;  Location: [place];  Service: Endoscopy;;  gastric biopsy Descending colon polyp.     OB History  No data available       Home Medications    Prior to Admission medications   Medication Sig Start Date End Date Taking? Authorizing Provider  calcium carbonate (TUMS - DOSED IN MG ELEMENTAL CALCIUM) 500 MG chewable tablet Chew 1 tablet by mouth as needed for indigestion or heartburn.   Yes [provider]  dicyclomine (BENTYL) 10 MG capsule TAKE 1 CAPSULE BY MOUTH 3 TIMES A DAY 09/14/15  Yes [name], MD  fluticasone (FLONASE) 50 MCG/ACT nasal spray Place 2 sprays into both nostrils  daily. 07/02/16  Yes Lorin Picket, PA-C  loratadine (CLARITIN) 10 MG tablet Take 1 tablet by mouth 1 day or 1 dose.   Yes [provider]  Multiple Vitamins-Minerals (MULTIVITAMIN & MINERAL) LIQD Take 1 tablet by mouth 1 day or 1 dose. pill   Yes [provider]  pantoprazole (PROTONIX) 40 MG tablet Take 1 tablet (40 mg total) by mouth daily. 10/30/16  Yes Lucilla Lame, MD  Probiotic Product (PROBIOTIC DAILY PO) Take by mouth.   Yes [provider]  celecoxib (CELEBREX) 400 MG capsule Take 1 capsule (400 mg total) by mouth daily after breakfast. 12/24/16   Frederich Cha, MD  metaxalone (SKELAXIN) 800 MG tablet Take 1 tablet (800 mg total) by mouth 3 (three) times daily. 12/24/16   Frederich Cha, MD  traMADol (ULTRAM) 50 MG tablet Take 1 tablet (50 mg total) by mouth every 6 (six) hours as needed. 12/24/16   Frederich Cha, MD    Family History Family History  Problem Relation Age of Onset  . COPD Mother   . Asthma Mother   . Cancer Father   . Sudden death Neg Hx   . Hypertension Neg Hx   . Heart attack Neg Hx   . Diabetes Neg Hx   . Hyperlipidemia Neg Hx   . Breast cancer Neg Hx     Social History Social History  Substance Use Topics  . Smoking status: Former Smoker    Quit date: 04/18/2011  . Smokeless tobacco: Never Used  . Alcohol use 1.8 oz/week    3 Shots of liquor per week     Allergies   Iodinated diagnostic agents   Review of Systems Review of Systems  Musculoskeletal: Positive for back pain.  Neurological: Positive for numbness.  All other systems reviewed and are negative.    Physical Exam Triage Vital Signs ED Triage Vitals  Enc Vitals Group     BP 12/24/16 0843 113/72     Pulse Rate 12/24/16 0843 66     Resp 12/24/16 0843 16     Temp 12/24/16 0843 98.4 F (36.9 C)     Temp Source 12/24/16 0843 Oral     SpO2 12/24/16 0843 99 %     Weight 12/24/16 0845 286 lb (129.7 kg)     Height 12/24/16 0845 5\' 9"  (1.753 m)     Head  Circumference --      Peak Flow --      Pain Score 12/24/16 0845 10     Pain Loc --      Pain Edu? --      Excl. in [place]? --    No data found.   Updated Vital Signs BP 113/72 (BP Location: Left Arm)   Pulse 66   Temp 98.4 F (36.9 C) (Oral)   Resp 16   Ht 5\' 9"  (1.753 m)   Wt 286 lb (129.7 kg)   LMP 11/29/2016  SpO2 99%   BMI 42.23 kg/m   Visual Acuity Right Eye Distance:   Left Eye Distance:   Bilateral Distance:    Right Eye Near:   Left Eye Near:    Bilateral Near:     Physical Exam  Constitutional: She is oriented to person, place, and time. She appears well-developed and well-nourished. No distress.  HENT:  Head: Normocephalic and atraumatic.  Right Ear: External ear normal.  Left Ear: External ear normal.  Nose: Nose normal.  Mouth/Throat: Oropharynx is clear and moist.  Eyes: Pupils are equal, round, and reactive to light. EOM are normal.  Neck: Normal range of motion. Neck supple.  Pulmonary/Chest: Effort normal.  Musculoskeletal: She exhibits tenderness.       Lumbar back: She exhibits tenderness, bony tenderness and spasm.       Back:       Arms: Neurological: She is alert and oriented to person, place, and time. She displays normal reflexes. No cranial nerve deficit. She exhibits normal muscle tone. Coordination normal.  Skin: Skin is warm. She is not diaphoretic.  Psychiatric: She has a normal mood and affect.  Vitals reviewed.    UC Treatments / Results  Labs (all labs ordered are listed, but only abnormal results are displayed) Labs Reviewed - No data to display  EKG  EKG Interpretation None       Radiology No results found.  Procedures Procedures (including critical care time)  Medications Ordered in UC Medications  ketorolac (TORADOL) injection 60 mg (60 mg Intramuscular Given 12/24/16 0911)     Initial Impression / Assessment and Plan / UC Course  I have reviewed the triage vital signs and the nursing notes.  Pertinent  labs & imaging results that were available during my care of the patient were reviewed by me and considered in my medical decision making (see chart for details).     Will administer 60 mg of Toradol IM. Place patient on Skelaxin muscle relaxer Celebrex 400 mg daily since she's had a history of ulcer disease a year ago and also will place her on Skelaxin 800 mg 3 times a day for muscle spasms. Patient checked and no chronic drug reporting site and found to haveIssues follow-up PCP in one week if not better she declined work note this time.  Final Clinical Impressions(s) / UC Diagnoses   Final diagnoses:  Muscle spasm of back  Muscle strain  Low back pain with right-sided sciatica, unspecified back pain laterality, unspecified chronicity    New Prescriptions Discharge Medication List as of 12/24/2016  9:11 AM    START taking these medications   Details  celecoxib (CELEBREX) 400 MG capsule Take 1 capsule (400 mg total) by mouth daily after breakfast., Starting Sun 12/24/2016, Normal    metaxalone (SKELAXIN) 800 MG tablet Take 1 tablet (800 mg total) by mouth 3 (three) times daily., Starting Sun 12/24/2016, Normal    traMADol (ULTRAM) 50 MG tablet Take 1 tablet (50 mg total) by mouth every 6 (six) hours as needed., Starting Sun 12/24/2016, Normal       Note: This dictation was prepared with Dragon dictation along with smaller phrase technology. Any transcriptional errors that result from this process are unintentional.  Controlled Substance Prescriptions [place] Controlled Substance Registry consulted? Yes, I have consulted the [place] Controlled Substances Registry for this patient, and feel the risk/benefit ratio today is favorable for proceeding with this prescription for a controlled substance.   Frederich Cha, MD 12/24/16 [id number]

## 2016-12-28 ENCOUNTER — Ambulatory Visit
Admission: RE | Admit: 2016-12-28 | Discharge: 2016-12-28 | Disposition: A | Discharge status: Home / Self Care | Payer: BLUE CROSS/BLUE SHIELD | Source: Ambulatory Visit | Attending: Family Medicine | Admitting: Family Medicine

## 2016-12-28 ENCOUNTER — Ambulatory Visit (INDEPENDENT_AMBULATORY_CARE_PROVIDER_SITE_OTHER): Payer: BLUE CROSS/BLUE SHIELD | Admitting: Family Medicine

## 2016-12-28 ENCOUNTER — Encounter: Payer: Self-pay | Admitting: Family Medicine

## 2016-12-28 VITALS — BP 100/62 | HR 68 | Ht 69.0 in | Wt 294.0 lb

## 2016-12-28 DIAGNOSIS — M5126 Other intervertebral disc displacement, lumbar region: Secondary | ICD-10-CM

## 2016-12-28 DIAGNOSIS — M545 Low back pain: Secondary | ICD-10-CM | POA: Diagnosis not present

## 2016-12-28 DIAGNOSIS — M5134 Other intervertebral disc degeneration, thoracic region: Secondary | ICD-10-CM | POA: Diagnosis not present

## 2016-12-28 DIAGNOSIS — M5136 Other intervertebral disc degeneration, lumbar region: Secondary | ICD-10-CM | POA: Diagnosis not present

## 2016-12-28 MED ORDER — HYDROCODONE-ACETAMINOPHEN 5-325 MG PO TABS: 1.0000 | ORAL_TABLET | Freq: Four times a day (QID) | ORAL | 0 refills | Status: DC | PRN

## 2016-12-28 MED ORDER — PREDNISONE 10 MG PO TABS: 10.0000 mg | ORAL_TABLET | Freq: Every day | ORAL | 0 refills | Status: DC

## 2016-12-28 NOTE — Progress Notes (Signed)
Name: Rachael Gonzales   MRN: [id number]    DOB: [birth year]   Date:12/28/2016       Progress Note  Subjective  Chief Complaint  Chief Complaint  Patient presents with  . Back Pain    been working painting, climbing ladders, etc- bent down to pick up cat and "felt a pain shoot up my back and down into butt cheek"    Back Pain  This is a recurrent (intermitant 28 years/ surg for herniated) problem. The current episode started in the past 7 days. The problem occurs daily. The problem has been waxing and waning since onset. The quality of the pain is described as aching. Radiates to: right buttock. The pain is at a severity of 8/10. The pain is moderate. The symptoms are aggravated by bending and twisting. Pertinent negatives include no abdominal pain, bladder incontinence, bowel incontinence, chest pain, dysuria, fever, headaches, paresis, paresthesias, tingling or weight loss.    No problem-specific Assessment & Plan notes found for this encounter.   Past Medical History:  Diagnosis Date  . Allergy   . Depression   . Diverticulitis   . Headache    sinus  . Irritable bowel     Past Surgical History:  Procedure Laterality Date  . BACK SURGERY  04/17/1990   L5-S1  . CESAREAN SECTION    . COLONOSCOPY WITH PROPOFOL N/A 03/03/2016   Procedure: COLONOSCOPY WITH PROPOFOL;  Surgeon: Lucilla Lame, MD;  Location: [place];  Service: Endoscopy;  Laterality: N/A;  . ESOPHAGOGASTRODUODENOSCOPY (EGD) WITH PROPOFOL N/A 03/03/2016   Procedure: ESOPHAGOGASTRODUODENOSCOPY (EGD) WITH PROPOFOL;  Surgeon: Lucilla Lame, MD;  Location: [place];  Service: Endoscopy;  Laterality: N/A;  . POLYPECTOMY  03/03/2016   Procedure: POLYPECTOMY;  Surgeon: Lucilla Lame, MD;  Location: [place];  Service: Endoscopy;;  gastric biopsy Descending colon polyp.     Family History  Problem Relation Age of Onset  . COPD Mother   . Asthma Mother   . Cancer Father   . Sudden death Neg Hx    . Hypertension Neg Hx   . Heart attack Neg Hx   . Diabetes Neg Hx   . Hyperlipidemia Neg Hx   . Breast cancer Neg Hx     Social History   Social History  . Marital status: Married    Spouse name: N/A  . Number of children: N/A  . Years of education: N/A   Occupational History  . Not on file.   Social History Main Topics  . Smoking status: Former Smoker    Quit date: 04/18/2011  . Smokeless tobacco: Never Used  . Alcohol use 1.8 oz/week    3 Shots of liquor per week  . Drug use: No  . Sexual activity: Yes   Other Topics Concern  . Not on file   Social History Narrative  . No narrative on file    Allergies  Allergen Reactions  . Iodinated Diagnostic Agents Hives    CT A/P w on 01/10/16. Itching and hives.    Outpatient Medications Prior to Visit  Medication Sig Dispense Refill  . dicyclomine (BENTYL) 10 MG capsule TAKE 1 CAPSULE BY MOUTH 3 TIMES A DAY 45 capsule 0  . fluticasone (FLONASE) 50 MCG/ACT nasal spray Place 2 sprays into both nostrils daily. 16 g 0  . loratadine (CLARITIN) 10 MG tablet Take 1 tablet by mouth 1 day or 1 dose.    . Multiple Vitamins-Minerals (MULTIVITAMIN & MINERAL) LIQD Take 1  tablet by mouth 1 day or 1 dose. pill    . pantoprazole (PROTONIX) 40 MG tablet Take 1 tablet (40 mg total) by mouth daily. 90 tablet 3  . Probiotic Product (PROBIOTIC DAILY PO) Take by mouth.    . traMADol (ULTRAM) 50 MG tablet Take 1 tablet (50 mg total) by mouth every 6 (six) hours as needed. 15 tablet 0  . celecoxib (CELEBREX) 400 MG capsule Take 1 capsule (400 mg total) by mouth daily after breakfast. (Patient not taking: Reported on 12/28/2016) 30 capsule 0  . metaxalone (SKELAXIN) 800 MG tablet Take 1 tablet (800 mg total) by mouth 3 (three) times daily. (Patient not taking: Reported on 12/28/2016) 60 tablet 0  . calcium carbonate (TUMS - DOSED IN MG ELEMENTAL CALCIUM) 500 MG chewable tablet Chew 1 tablet by mouth as needed for indigestion or heartburn.     No  facility-administered medications prior to visit.     Review of Systems  Constitutional: Negative for chills, fever, malaise/fatigue and weight loss.  HENT: Negative for ear discharge, ear pain and sore throat.   Eyes: Negative for blurred vision.  Respiratory: Negative for cough, sputum production, shortness of breath and wheezing.   Cardiovascular: Negative for chest pain, palpitations and leg swelling.  Gastrointestinal: Negative for abdominal pain, blood in stool, bowel incontinence, constipation, diarrhea, heartburn, melena and nausea.  Genitourinary: Negative for bladder incontinence, dysuria, frequency, hematuria and urgency.  Musculoskeletal: Positive for back pain. Negative for joint pain, myalgias and neck pain.  Skin: Negative for rash.  Neurological: Negative for dizziness, tingling, sensory change, focal weakness, headaches and paresthesias.  Endo/Heme/Allergies: Negative for environmental allergies and polydipsia. Does not bruise/bleed easily.  Psychiatric/Behavioral: Negative for depression and suicidal ideas. The patient is not nervous/anxious and does not have insomnia.      Objective  Vitals:   12/28/16 1354  BP: 100/62  Pulse: 68  Weight: 294 lb (133.4 kg)  Height: 5\' 9"  (1.753 m)    Physical Exam  Constitutional: She is well-developed, well-nourished, and in no distress. No distress.  HENT:  Head: Normocephalic and atraumatic.  Right Ear: External ear normal.  Left Ear: External ear normal.  Nose: Nose normal.  Mouth/Throat: Oropharynx is clear and moist.  Eyes: Pupils are equal, round, and reactive to light. Conjunctivae and EOM are normal. Right eye exhibits no discharge. Left eye exhibits no discharge.  Neck: Normal range of motion. Neck supple. No JVD present. No thyromegaly present.  Cardiovascular: Normal rate, regular rhythm, normal heart sounds and intact distal pulses.  Exam reveals no gallop and no friction rub.   No murmur  heard. Pulmonary/Chest: Effort normal and breath sounds normal.  Abdominal: Soft. Bowel sounds are normal. She exhibits no mass. There is no tenderness. There is no guarding.  Musculoskeletal: Normal range of motion. She exhibits no edema.       Lumbar back: She exhibits spasm.  Lymphadenopathy:    She has no cervical adenopathy.  Neurological: She is alert. She has normal sensation, normal strength and normal reflexes. She has an abnormal Straight Leg Raise Test.  Skin: Skin is warm and dry. She is not diaphoretic.  Psychiatric: Mood and affect normal.  Nursing note and vitals reviewed.     Assessment & Plan  Problem List Items Addressed This Visit    None    Visit Diagnoses    Herniated lumbar intervertebral disc    -  Primary   Relevant Medications   predniSONE (DELTASONE) 10 MG tablet  HYDROcodone-acetaminophen (NORCO/VICODIN) 5-325 MG tablet   Other Relevant Orders   DG Lumbar Spine Complete (Completed)      Meds ordered this encounter  Medications  . predniSONE (DELTASONE) 10 MG tablet    Sig: Take 1 tablet (10 mg total) by mouth daily with breakfast.    Dispense:  30 tablet    Refill:  0    Taper 4,4,4,3,3,3,2,2,2,1,1,1  . HYDROcodone-acetaminophen (NORCO/VICODIN) 5-325 MG tablet    Sig: Take 1 tablet by mouth every 6 (six) hours as needed for moderate pain.    Dispense:  20 tablet    Refill:  0      Dr. Lyrical Sowle [facility]  12/28/16

## 2016-12-28 NOTE — Patient Instructions (Signed)
Herniated Disk A herniated disk, also called a ruptured disk or slipped disk, occurs when a disk in the spine bulges out too far. Between the bones in the spine (vertebrae), there are oval disks that are made of a soft, spongy center that is surrounded by a tough outer ring. The disks connect your vertebrae, help your spine move, and absorb shocks from your movement. When you have a herniated disk, the spongy center of the disk bulges out or breaks through the outer ring. It can press on a nerve between the vertebrae and cause pain. This can occur anywhere in the back or neck area, but the lower back is most commonly affected. What are the causes? This condition may be caused by:  Age-related wear and tear. The spongy centers of spinal disks tend to shrink and dry out with age, which makes them more likely to herniate.  Sudden injury, such as a strain or sprain.  What increases the risk? Aging is the main risk factor for a herniated disk. Other risk factors include:  Being a man who is 30-50 years old.  Frequently doing activities that involve heavy lifting, bending, or twisting.  Frequently driving for long hours at a time.  Not getting enough exercise.  Being overweight.  Smoking.  Having a family history of back problems or herniated disks.  Being pregnant or giving birth.  Having poor nutrition.  Being tall.  What are the signs or symptoms? Symptoms may vary depending on where your herniated disk is located.  A herniated disk in the lower back may cause sharp pain in: ? Part of the arm, leg, hip, or buttocks. ? The back of the lower leg (calf). ? The lower back, spreading down through the leg into the foot (sciatica).  A herniated disk in the neck may cause dizziness and vertigo. It may also cause pain or weakness in: ? The neck. ? The shoulder blades. ? Upper arm, forearm, or fingers.  You may also have muscle weakness. It may be difficult to: ? Lift your leg or  arm. ? Stand on your toes. ? Squeeze tightly with one of your hands.  Other symptoms may include: ? Numbness or tingling in the affected areas of the hands, arms, feet, or legs. ? Inability to control when you urinate or when you have bowel movements. This is a rare but serious sign of a severe herniated disk in the lower back.  How is this diagnosed? This condition may be diagnosed based on:  Your symptoms.  Your medical history.  A physical exam. The exam may include: ? Straight-leg test. You will lie on your back while your health care provider lifts your leg, keeping your knee straight. If you feel pain, you likely have a herniated disk. ? Neurological tests. This includes checking for numbness, reflexes, muscle strength, and posture.  Imaging tests, such as: ? X-rays. ? MRI. ? CT scan. ? Electromyogram (EMG) to check the nerves that control muscles. This test may be used to determine which nerves are affected by your herniated disk.  How is this treated? Treatment for this condition may include:  A short period of rest. This is usually the first treatment. ? You may be on bed rest for up to 2 days, or you may be instructed to stay home and avoid physical activity. ? If you have a herniated disk in your lower back, avoid sitting as much as possible. Sitting increases pressure on the disk.  Medicines. These may   include: ? NSAIDs to help reduce pain and swelling. ? Muscle relaxants to prevent sudden tightening of the back muscles (back spasms). ? Prescription pain medicines, if you have severe pain.  Steroid injections in the area of the herniated disk. This can help reduce pain and swelling.  Physical therapy to strengthen your back muscles.  In many cases, symptoms go away with treatment over a period of days or weeks. You will most likely be free of symptoms after 3-4 months. If other treatments do not help to relieve your symptoms, you may need surgery. Follow these  instructions at home: Medicines  Take over-the-counter and prescription medicines only as told by your health care provider.  Do not drive or use heavy machinery while taking prescription pain medicine. Activity  Rest as directed.  After your rest period: ? Return to your normal activities and gradually begin exercising as told by your health care provider. Ask your health care provider what activities and exercises are safe for you. ? Use good posture. ? Avoid movements that cause pain. ? Do not lift anything that is heavier than 10 lb (4.5 kg) until your health care provider says this is safe. ? Do not sit or stand for long periods of time without changing positions. ? Do not sit for long periods of time without getting up and moving around.  If physical therapy was prescribed, do exercises as instructed.  Aim to strengthen muscles in your back and abdomen with exercises like crunches, swimming, or walking. General instructions  Do not use any products that contain nicotine or tobacco, such as cigarettes and e-cigarettes. These products can delay healing. If you need help quitting, ask your health care provider.  Do not wear high-heeled shoes.  Do not sleep on your belly.  If you are overweight, work with your health care provider to lose weight safely.  To prevent or treat constipation while you are taking prescription pain medicine, your health care provider may recommend that you: ? Drink enough fluid to keep your urine clear or pale yellow. ? Take over-the-counter or prescription medicines. ? Eat foods that are high in fiber, such as fresh fruits and vegetables, whole grains, and beans. ? Limit foods that are high in fat and processed sugars, such as fried and sweet foods.  Keep all follow-up visits as told by your health care provider. This is important. How is this prevented?  Maintain a healthy weight.  Try to avoid stressful situations.  Maintain physical  fitness. Do at least 150 minutes of moderate-intensity exercise each week, such as brisk walking or water aerobics.  When lifting objects: ? Keep your feet at least shoulder-width apart and tighten your abdominal muscles. ? Keep your spine neutral as you bend your knees and hips. It is important to lift using the strength of your legs, not your back. Do not lock your knees straight out. ? Always ask for help to lift heavy or awkward objects. Contact a health care provider if:  You have back pain or neck pain that does not get better after 6 weeks.  You have severe pain in your back, neck, legs, or arms.  You develop numbness, tingling, or weakness in any part of your body. Get help right away if:  You cannot move your arms or legs.  You cannot control when you urinate or have bowel movements.  You feel dizzy or you faint.  You have shortness of breath. This information is not intended to replace advice given   to you by your health care provider. Make sure you discuss any questions you have with your health care provider. Document Released: 03/31/2000 Document Revised: 11/29/2015 Document Reviewed: 11/29/2015 [employer] Interactive Patient Education  2017 [employer]  

## 2017-01-03 DIAGNOSIS — Z86018 Personal history of other benign neoplasm: Secondary | ICD-10-CM | POA: Diagnosis not present

## 2017-01-03 DIAGNOSIS — L304 Erythema intertrigo: Secondary | ICD-10-CM | POA: Diagnosis not present

## 2017-01-03 DIAGNOSIS — D229 Melanocytic nevi, unspecified: Secondary | ICD-10-CM | POA: Diagnosis not present

## 2017-01-03 DIAGNOSIS — B078 Other viral warts: Secondary | ICD-10-CM | POA: Diagnosis not present

## 2017-01-08 ENCOUNTER — Other Ambulatory Visit: Payer: Self-pay

## 2017-01-11 DIAGNOSIS — M545 Low back pain: Secondary | ICD-10-CM | POA: Diagnosis not present

## 2017-01-15 DIAGNOSIS — M5440 Lumbago with sciatica, unspecified side: Secondary | ICD-10-CM | POA: Diagnosis not present

## 2017-01-15 DIAGNOSIS — M6281 Muscle weakness (generalized): Secondary | ICD-10-CM | POA: Diagnosis not present

## 2017-01-17 ENCOUNTER — Other Ambulatory Visit: Payer: Self-pay | Admitting: Neurosurgery

## 2017-01-17 DIAGNOSIS — M545 Low back pain, unspecified: Secondary | ICD-10-CM

## 2017-01-19 DIAGNOSIS — M5440 Lumbago with sciatica, unspecified side: Secondary | ICD-10-CM | POA: Diagnosis not present

## 2017-01-22 ENCOUNTER — Ambulatory Visit
Admission: RE | Admit: 2017-01-22 | Discharge: 2017-01-22 | Disposition: A | Discharge status: Home / Self Care | Payer: BLUE CROSS/BLUE SHIELD | Source: Ambulatory Visit | Attending: Neurosurgery | Admitting: Neurosurgery

## 2017-01-22 DIAGNOSIS — M545 Low back pain, unspecified: Secondary | ICD-10-CM

## 2017-01-22 DIAGNOSIS — M5126 Other intervertebral disc displacement, lumbar region: Secondary | ICD-10-CM | POA: Diagnosis not present

## 2017-01-22 DIAGNOSIS — M6281 Muscle weakness (generalized): Secondary | ICD-10-CM | POA: Diagnosis not present

## 2017-01-22 DIAGNOSIS — M47816 Spondylosis without myelopathy or radiculopathy, lumbar region: Secondary | ICD-10-CM | POA: Diagnosis not present

## 2017-01-22 DIAGNOSIS — M48061 Spinal stenosis, lumbar region without neurogenic claudication: Secondary | ICD-10-CM | POA: Insufficient documentation

## 2017-01-22 DIAGNOSIS — M5440 Lumbago with sciatica, unspecified side: Secondary | ICD-10-CM | POA: Diagnosis not present

## 2017-01-24 ENCOUNTER — Ambulatory Visit: Payer: BLUE CROSS/BLUE SHIELD

## 2017-01-25 DIAGNOSIS — D485 Neoplasm of uncertain behavior of skin: Secondary | ICD-10-CM | POA: Diagnosis not present

## 2017-01-25 DIAGNOSIS — B078 Other viral warts: Secondary | ICD-10-CM | POA: Diagnosis not present

## 2017-01-30 DIAGNOSIS — M5440 Lumbago with sciatica, unspecified side: Secondary | ICD-10-CM | POA: Diagnosis not present

## 2017-02-02 DIAGNOSIS — M5440 Lumbago with sciatica, unspecified side: Secondary | ICD-10-CM | POA: Diagnosis not present

## 2017-02-02 DIAGNOSIS — M6281 Muscle weakness (generalized): Secondary | ICD-10-CM | POA: Diagnosis not present

## 2017-02-26 DIAGNOSIS — B078 Other viral warts: Secondary | ICD-10-CM | POA: Diagnosis not present

## 2017-02-27 ENCOUNTER — Ambulatory Visit: Payer: BLUE CROSS/BLUE SHIELD | Admitting: Family Medicine

## 2017-02-27 ENCOUNTER — Encounter: Payer: Self-pay | Admitting: Family Medicine

## 2017-02-27 VITALS — BP 130/86 | HR 68 | Ht 69.0 in | Wt 294.0 lb

## 2017-02-27 DIAGNOSIS — N644 Mastodynia: Secondary | ICD-10-CM | POA: Diagnosis not present

## 2017-02-27 DIAGNOSIS — D485 Neoplasm of uncertain behavior of skin: Secondary | ICD-10-CM | POA: Diagnosis not present

## 2017-02-27 DIAGNOSIS — N63 Unspecified lump in unspecified breast: Secondary | ICD-10-CM

## 2017-02-27 DIAGNOSIS — D229 Melanocytic nevi, unspecified: Secondary | ICD-10-CM | POA: Diagnosis not present

## 2017-02-27 NOTE — Progress Notes (Signed)
Name: Rachael Gonzales   MRN: [id number]    DOB: Dec 22, 1965   Date:02/27/2017       Progress Note  Subjective  Chief Complaint  Chief Complaint  Patient presents with  . Breast Mass    L) breast pain- found a nodule at 3:00 during breast exam at home  . Medication Refill    bentyl refilled    Patient presents for eval of palpable breast area.   Medication Refill  This is a new (IBS) problem. The current episode started more than 1 year ago. The problem occurs daily. The problem has been waxing and waning. Associated symptoms include abdominal pain. Pertinent negatives include no anorexia, arthralgias, change in bowel habit, chest pain, chills, congestion, coughing, diaphoresis, fatigue, fever, headaches, joint swelling, myalgias, nausea, neck pain, numbness, rash, sore throat, swollen glands, urinary symptoms, vertigo, visual change, vomiting or weakness. Nothing aggravates the symptoms. Treatments tried: bentyl. The treatment provided moderate relief.    No problem-specific Assessment & Plan notes found for this encounter.   Past Medical History:  Diagnosis Date  . Allergy   . Depression   . Diverticulitis   . Headache    sinus  . Irritable bowel     Past Surgical History:  Procedure Laterality Date  . BACK SURGERY  04/17/1990   L5-S1  . CESAREAN SECTION      Family History  Problem Relation Age of Onset  . COPD Mother   . Asthma Mother   . Cancer Father   . Sudden death Neg Hx   . Hypertension Neg Hx   . Heart attack Neg Hx   . Diabetes Neg Hx   . Hyperlipidemia Neg Hx   . Breast cancer Neg Hx     Social History   Socioeconomic History  . Marital status: Married    Spouse name: Not on file  . Number of children: Not on file  . Years of education: Not on file  . Highest education level: Not on file  Social Needs  . Financial resource strain: Not on file  . Food insecurity - worry: Not on file  . Food insecurity - inability: Not on file  .  Transportation needs - medical: Not on file  . Transportation needs - non-medical: Not on file  Occupational History  . Not on file  Tobacco Use  . Smoking status: Former Smoker    Last attempt to quit: 04/18/2011    Years since quitting: 5.8  . Smokeless tobacco: Never Used  Substance and Sexual Activity  . Alcohol use: Yes    Alcohol/week: 1.8 oz    Types: 3 Shots of liquor per week  . Drug use: No  . Sexual activity: Yes  Other Topics Concern  . Not on file  Social History Narrative  . Not on file    Allergies  Allergen Reactions  . Iodinated Diagnostic Agents Hives    CT A/P w on 01/10/16. Itching and hives.    Outpatient Medications Prior to Visit  Medication Sig Dispense Refill  . dicyclomine (BENTYL) 10 MG capsule TAKE 1 CAPSULE BY MOUTH 3 TIMES A DAY 45 capsule 0  . fluticasone (FLONASE) 50 MCG/ACT nasal spray Place 2 sprays into both nostrils daily. 16 g 0  . loratadine (CLARITIN) 10 MG tablet Take 1 tablet by mouth 1 day or 1 dose.    . Multiple Vitamins-Minerals (MULTIVITAMIN & MINERAL) LIQD Take 1 tablet by mouth 1 day or 1 dose. pill    .  pantoprazole (PROTONIX) 40 MG tablet Take 1 tablet (40 mg total) by mouth daily. 90 tablet 3  . acetaminophen (TYLENOL) 325 MG tablet Take 650 mg by mouth every 6 (six) hours as needed.    . celecoxib (CELEBREX) 400 MG capsule Take 1 capsule (400 mg total) by mouth daily after breakfast. (Patient not taking: Reported on 12/28/2016) 30 capsule 0  . HYDROcodone-acetaminophen (NORCO/VICODIN) 5-325 MG tablet Take 1 tablet by mouth every 6 (six) hours as needed for moderate pain. 20 tablet 0  . ibuprofen (ADVIL,MOTRIN) 200 MG tablet Take 600 mg by mouth every 6 (six) hours as needed.    . metaxalone (SKELAXIN) 800 MG tablet Take 1 tablet (800 mg total) by mouth 3 (three) times daily. (Patient not taking: Reported on 12/28/2016) 60 tablet 0  . orphenadrine (NORFLEX) 100 MG tablet Take 100 mg by mouth 2 (two) times daily.  0  . predniSONE  (DELTASONE) 10 MG tablet Take 1 tablet (10 mg total) by mouth daily with breakfast. 30 tablet 0  . Probiotic Product (PROBIOTIC DAILY PO) Take by mouth.    . traMADol (ULTRAM) 50 MG tablet Take 1 tablet (50 mg total) by mouth every 6 (six) hours as needed. 15 tablet 0   No facility-administered medications prior to visit.     Review of Systems  Constitutional: Negative for chills, diaphoresis, fatigue, fever, malaise/fatigue and weight loss.  HENT: Negative for congestion, ear discharge, ear pain and sore throat.   Eyes: Negative for blurred vision.  Respiratory: Negative for cough, sputum production, shortness of breath and wheezing.   Cardiovascular: Negative for chest pain, palpitations and leg swelling.  Gastrointestinal: Positive for abdominal pain. Negative for anorexia, blood in stool, change in bowel habit, constipation, diarrhea, heartburn, melena, nausea and vomiting.  Genitourinary: Negative for dysuria, frequency, hematuria and urgency.  Musculoskeletal: Negative for arthralgias, back pain, joint pain, joint swelling, myalgias and neck pain.  Skin: Negative for rash.  Neurological: Negative for dizziness, vertigo, tingling, sensory change, focal weakness, weakness, numbness and headaches.  Endo/Heme/Allergies: Negative for environmental allergies and polydipsia. Does not bruise/bleed easily.  Psychiatric/Behavioral: Negative for depression and suicidal ideas. The patient is not nervous/anxious and does not have insomnia.      Objective  Vitals:   02/27/17 1416  BP: 130/86  Pulse: 68  Weight: 294 lb (133.4 kg)  Height: 5\' 9"  (1.753 m)    Physical Exam  Constitutional: She is well-developed, well-nourished, and in no distress. No distress.  HENT:  Head: Normocephalic and atraumatic.  Right Ear: External ear normal.  Left Ear: External ear normal.  Nose: Nose normal.  Mouth/Throat: Oropharynx is clear and moist.  Eyes: Conjunctivae and EOM are normal. Pupils are  equal, round, and reactive to light. Right eye exhibits no discharge. Left eye exhibits no discharge.  Neck: Normal range of motion. Neck supple. No JVD present. No thyromegaly present.  Cardiovascular: Normal rate, regular rhythm, normal heart sounds and intact distal pulses. Exam reveals no gallop and no friction rub.  No murmur heard. Pulmonary/Chest: Effort normal and breath sounds normal. She has no wheezes. She has no rales. Right breast exhibits no inverted nipple, no mass, no nipple discharge, no skin change and no tenderness. Left breast exhibits mass and tenderness. Left breast exhibits no inverted nipple, no nipple discharge and no skin change. Breasts are symmetrical.  palpable nodule 6 o'clock left/tenderness/ /fullness 3 o'clock  Abdominal: Soft. Bowel sounds are normal. She exhibits no mass. There is no tenderness. There is  no guarding.  Musculoskeletal: Normal range of motion. She exhibits no edema.  Lymphadenopathy:    She has no cervical adenopathy.    She has no axillary adenopathy.  Neurological: She is alert. She has normal reflexes.  Skin: Skin is warm and dry. She is not diaphoretic.  Psychiatric: Mood and affect normal.  Nursing note and vitals reviewed.     Assessment & Plan  Problem List Items Addressed This Visit    None      No orders of the defined types were placed in this encounter.     Dr. Mac[facility]  02/27/17

## 2017-03-06 ENCOUNTER — Ambulatory Visit
Admission: RE | Admit: 2017-03-06 | Discharge: 2017-03-06 | Disposition: A | Discharge status: Home / Self Care | Payer: BLUE CROSS/BLUE SHIELD | Source: Ambulatory Visit | Attending: Family Medicine | Admitting: Family Medicine

## 2017-03-06 DIAGNOSIS — R922 Inconclusive mammogram: Secondary | ICD-10-CM | POA: Diagnosis not present

## 2017-03-06 DIAGNOSIS — R928 Other abnormal and inconclusive findings on diagnostic imaging of breast: Secondary | ICD-10-CM | POA: Insufficient documentation

## 2017-03-06 DIAGNOSIS — N644 Mastodynia: Secondary | ICD-10-CM

## 2017-03-06 DIAGNOSIS — N6489 Other specified disorders of breast: Secondary | ICD-10-CM | POA: Insufficient documentation

## 2017-03-06 DIAGNOSIS — N63 Unspecified lump in unspecified breast: Secondary | ICD-10-CM

## 2017-03-13 ENCOUNTER — Other Ambulatory Visit: Payer: Self-pay

## 2017-03-13 DIAGNOSIS — K589 Irritable bowel syndrome without diarrhea: Secondary | ICD-10-CM

## 2017-03-13 MED ORDER — DICYCLOMINE HCL 10 MG PO CAPS: 10.0000 mg | ORAL_CAPSULE | Freq: Three times a day (TID) | ORAL | 0 refills | Status: DC

## 2017-03-19 DIAGNOSIS — B078 Other viral warts: Secondary | ICD-10-CM | POA: Diagnosis not present

## 2017-03-21 ENCOUNTER — Ambulatory Visit: Payer: BLUE CROSS/BLUE SHIELD | Admitting: Family Medicine

## 2017-04-11 DIAGNOSIS — B078 Other viral warts: Secondary | ICD-10-CM | POA: Diagnosis not present

## 2017-06-26 ENCOUNTER — Other Ambulatory Visit: Payer: Self-pay

## 2017-07-04 ENCOUNTER — Encounter: Payer: Self-pay | Admitting: Family Medicine

## 2017-07-04 ENCOUNTER — Ambulatory Visit: Payer: BLUE CROSS/BLUE SHIELD | Admitting: Family Medicine

## 2017-07-04 VITALS — BP 100/82 | HR 72 | Ht 69.0 in | Wt 293.0 lb

## 2017-07-04 DIAGNOSIS — E785 Hyperlipidemia, unspecified: Secondary | ICD-10-CM

## 2017-07-04 DIAGNOSIS — K2100 Gastro-esophageal reflux disease with esophagitis, without bleeding: Secondary | ICD-10-CM

## 2017-07-04 DIAGNOSIS — K580 Irritable bowel syndrome with diarrhea: Secondary | ICD-10-CM | POA: Diagnosis not present

## 2017-07-04 DIAGNOSIS — K589 Irritable bowel syndrome without diarrhea: Secondary | ICD-10-CM

## 2017-07-04 DIAGNOSIS — L309 Dermatitis, unspecified: Secondary | ICD-10-CM

## 2017-07-04 DIAGNOSIS — Z6841 Body Mass Index (BMI) 40.0 and over, adult: Secondary | ICD-10-CM | POA: Diagnosis not present

## 2017-07-04 MED ORDER — DICYCLOMINE HCL 10 MG PO CAPS: 10.0000 mg | ORAL_CAPSULE | Freq: Three times a day (TID) | ORAL | 11 refills | Status: DC

## 2017-07-04 MED ORDER — CLOBETASOL PROP EMOLLIENT BASE 0.05 % EX CREA: 1.0000 "application " | TOPICAL_CREAM | Freq: Two times a day (BID) | CUTANEOUS | 0 refills | Status: DC

## 2017-07-04 NOTE — Progress Notes (Signed)
Name: Rachael Gonzales   MRN: [id number]    DOB: Aug 08, 1965   Date:07/04/2017       Progress Note  Subjective  Chief Complaint  Chief Complaint  Patient presents with  . Irritable Bowel Syndrome    refill Bentyl  . Eczema    Rachael Gonzales does not work  . needs labs    lipid, renal    Patient presents for medication refills.   GI Problem  The primary symptoms include abdominal pain, diarrhea and rash. Primary symptoms do not include fever, weight loss, fatigue, nausea, vomiting, melena, hematemesis, jaundice, hematochezia, dysuria, myalgias or arthralgias. Primary symptoms comment: for IBS. The problem has been gradually improving (on bentyl).  The illness does not include anorexia. Significant associated medical issues include GERD. Risk factors: norisk factors.  Rash  This is a chronic problem. The current episode started more than 1 year ago. The problem has been waxing and waning since onset. The rash is diffuse. The rash is characterized by redness and itchiness. Associated symptoms include diarrhea. Pertinent negatives include no anorexia, congestion, cough, eye pain, facial edema, fatigue, fever, joint pain, nail changes, rhinorrhea, shortness of breath, sore throat or vomiting. Past treatments include topical steroids. The treatment provided moderate relief. Her past medical history is significant for allergies and eczema.  Gastroesophageal Reflux  She complains of abdominal pain and heartburn. She reports no coughing, no nausea or no sore throat. for IBS. This is a recurrent problem. The current episode started more than 1 year ago. The problem has been waxing and waning. The heartburn is of moderate intensity. Pertinent negatives include no fatigue, melena or weight loss.    No problem-specific Assessment & Plan notes found for this encounter.   Past Medical History:  Diagnosis Date  . Allergy   . Depression   . Diverticulitis   . Headache    sinus  . Irritable bowel      Past Surgical History:  Procedure Laterality Date  . BACK SURGERY  04/17/1990   L5-S1  . CESAREAN SECTION    . COLONOSCOPY WITH PROPOFOL N/A 03/03/2016   Procedure: COLONOSCOPY WITH PROPOFOL;  Surgeon: Lucilla Lame, MD;  Location: [place];  Service: Endoscopy;  Laterality: N/A;  . ESOPHAGOGASTRODUODENOSCOPY (EGD) WITH PROPOFOL N/A 03/03/2016   Procedure: ESOPHAGOGASTRODUODENOSCOPY (EGD) WITH PROPOFOL;  Surgeon: Lucilla Lame, MD;  Location: [place];  Service: Endoscopy;  Laterality: N/A;  . POLYPECTOMY  03/03/2016   Procedure: POLYPECTOMY;  Surgeon: Lucilla Lame, MD;  Location: [place];  Service: Endoscopy;;  gastric biopsy Descending colon polyp.     Family History  Problem Relation Age of Onset  . COPD Mother   . Asthma Mother   . Cancer Father   . Sudden death Neg Hx   . Hypertension Neg Hx   . Heart attack Neg Hx   . Diabetes Neg Hx   . Hyperlipidemia Neg Hx   . Breast cancer Neg Hx     Social History   Socioeconomic History  . Marital status: Married    Spouse name: Not on file  . Number of children: Not on file  . Years of education: Not on file  . Highest education level: Not on file  Social Needs  . Financial resource strain: Not on file  . Food insecurity - worry: Not on file  . Food insecurity - inability: Not on file  . Transportation needs - medical: Not on file  . Transportation needs - non-medical: Not on file  Occupational History  . Not on file  Tobacco Use  . Smoking status: Former Smoker    Last attempt to quit: 04/18/2011    Years since quitting: 6.2  . Smokeless tobacco: Never Used  Substance and Sexual Activity  . Alcohol use: Yes    Alcohol/week: 1.8 oz    Types: 3 Shots of liquor per week  . Drug use: No  . Sexual activity: Yes  Other Topics Concern  . Not on file  Social History Narrative  . Not on file    Allergies  Allergen Reactions  . Iodinated Diagnostic Agents Hives    CT A/P w on 01/10/16.  Itching and hives.    Outpatient Medications Prior to Visit  Medication Sig Dispense Refill  . fluticasone (FLONASE) 50 MCG/ACT nasal spray Place 2 sprays into both nostrils daily. 16 g 0  . loratadine (CLARITIN) 10 MG tablet Take 1 tablet by mouth 1 day or 1 dose.    . Multiple Vitamins-Minerals (MULTIVITAMIN & MINERAL) LIQD Take 1 tablet by mouth 1 day or 1 dose. pill    . pantoprazole (PROTONIX) 40 MG tablet Take 1 tablet (40 mg total) by mouth daily. 90 tablet 3  . dicyclomine (BENTYL) 10 MG capsule Take 1 capsule (10 mg total) by mouth 3 (three) times daily. 45 capsule 0   No facility-administered medications prior to visit.     Review of Systems  Constitutional: Negative for fatigue, fever and weight loss.  HENT: Negative for congestion, rhinorrhea and sore throat.   Eyes: Negative for pain.  Respiratory: Negative for cough and shortness of breath.   Gastrointestinal: Positive for abdominal pain, diarrhea and heartburn. Negative for anorexia, hematemesis, hematochezia, jaundice, melena, nausea and vomiting.  Genitourinary: Negative for dysuria.  Musculoskeletal: Negative for arthralgias, joint pain and myalgias.  Skin: Positive for rash. Negative for nail changes.     Objective  Vitals:   07/04/17 0833  BP: 100/82  Pulse: 72  Weight: 293 lb (132.9 kg)  Height: 5\' 9"  (1.753 m)    Physical Exam  Constitutional: She is oriented to person, place, and time and well-developed, well-nourished, and in no distress.  Cardiovascular: Normal rate, regular rhythm and normal heart sounds.  Pulmonary/Chest: Effort normal and breath sounds normal. No respiratory distress.  Abdominal: Soft.  Musculoskeletal: Normal range of motion.  Neurological: She is alert and oriented to person, place, and time.  Skin: Skin is warm and dry.  Psychiatric: Affect normal.  Nursing note and vitals reviewed.     Assessment & Plan  Problem List Items Addressed This Visit      Digestive    Reflux esophagitis   Irritable bowel syndrome - Primary   Relevant Medications   dicyclomine (BENTYL) 10 MG capsule     Other   BMI 40.0-44.9, adult (HCC)   Relevant Orders   Renal Function Panel    Other Visit Diagnoses    Eczema, unspecified type       Relevant Medications   Clobetasol Prop Emollient Base (CLOBETASOL PROPIONATE E) 0.05 % emollient cream   Dyslipidemia       Relevant Orders   Lipid panel      Meds ordered this encounter  Medications  . dicyclomine (BENTYL) 10 MG capsule    Sig: Take 1 capsule (10 mg total) by mouth 3 (three) times daily.    Dispense:  90 capsule    Refill:  11  . Clobetasol Prop Emollient Base (CLOBETASOL PROPIONATE E) 0.05 % emollient cream  Sig: Apply 1 application topically 2 (two) times daily.    Dispense:  30 g    Refill:  0  The 10-year ASCVD risk score Mikey Bussing DC Jr., et al., 2013) is: 1%   Values used to calculate the score:     Age: 69 years     Sex: Female     Is Non-Hispanic African American: No     Diabetic: No     Tobacco smoker: No     Systolic Blood Pressure: 218 mmHg     Is BP treated: No     HDL Cholesterol: 45 mg/dL     Total Cholesterol: 220 mg/dL    Dr. [facility]  07/04/17

## 2017-07-05 LAB — RENAL FUNCTION PANEL
ALBUMIN: 4.2 g/dL (ref 3.5–5.5)
BUN/Creatinine Ratio: 20 (ref 9–23)
BUN: 11 mg/dL (ref 6–24)
CALCIUM: 9 mg/dL (ref 8.7–10.2)
CHLORIDE: 100 mmol/L (ref 96–106)
CO2: 25 mmol/L (ref 20–29)
CREATININE: 0.54 mg/dL — AB (ref 0.57–1.00)
GFR calc Af Amer: 128 mL/min/{1.73_m2} (ref >59)
GFR calc non Af Amer: 111 mL/min/{1.73_m2} (ref >59)
Glucose: 95 mg/dL (ref 65–99)
Phosphorus: 2.5 mg/dL (ref 2.5–4.5)
Potassium: 4.7 mmol/L (ref 3.5–5.2)
Sodium: 137 mmol/L (ref 134–144)

## 2017-07-05 LAB — LIPID PANEL
CHOLESTEROL TOTAL: 215 mg/dL — AB (ref 100–199)
Chol/HDL Ratio: 4 ratio (ref 0.0–4.4)
HDL: 54 mg/dL (ref >39)
LDL Calculated: 135 mg/dL — ABNORMAL HIGH (ref 0–99)
Triglycerides: 131 mg/dL (ref 0–149)
VLDL CHOLESTEROL CAL: 26 mg/dL (ref 5–40)

## 2017-08-27 ENCOUNTER — Other Ambulatory Visit: Payer: Self-pay

## 2017-08-28 ENCOUNTER — Other Ambulatory Visit: Payer: Self-pay

## 2017-08-28 DIAGNOSIS — Z1231 Encounter for screening mammogram for malignant neoplasm of breast: Secondary | ICD-10-CM

## 2017-09-20 ENCOUNTER — Ambulatory Visit
Admission: RE | Admit: 2017-09-20 | Discharge: 2017-09-20 | Disposition: A | Discharge status: Home / Self Care | Payer: BLUE CROSS/BLUE SHIELD | Source: Ambulatory Visit | Attending: Family Medicine | Admitting: Family Medicine

## 2017-09-20 DIAGNOSIS — Z1231 Encounter for screening mammogram for malignant neoplasm of breast: Secondary | ICD-10-CM | POA: Diagnosis not present

## 2017-10-31 ENCOUNTER — Ambulatory Visit: Payer: BLUE CROSS/BLUE SHIELD | Admitting: Family Medicine

## 2017-10-31 ENCOUNTER — Encounter: Payer: Self-pay | Admitting: Family Medicine

## 2017-10-31 VITALS — BP 120/80 | HR 80 | Ht 69.0 in | Wt 294.0 lb

## 2017-10-31 DIAGNOSIS — I809 Phlebitis and thrombophlebitis of unspecified site: Secondary | ICD-10-CM

## 2017-10-31 DIAGNOSIS — I872 Venous insufficiency (chronic) (peripheral): Secondary | ICD-10-CM | POA: Diagnosis not present

## 2017-10-31 NOTE — Patient Instructions (Signed)
How to Use Compression Stockings Compression stockings are elastic socks that squeeze the legs. They help to increase blood flow to the legs, decrease swelling in the legs, and reduce the chance of developing blood clots in the lower legs. Compression stockings are often used by people who:  Are recovering from surgery.  Have poor circulation in their legs.  Are prone to getting blood clots in their legs.  Have varicose veins.  Sit or stay in bed for long periods of time.  How to use compression stockings Before you put on your compression stockings:  Make sure that they are the correct size. If you do not know your size, ask your health care provider.  Make sure that they are clean, dry, and in good condition.  Check them for rips and tears. Do not put them on if they are ripped or torn.  Put your stockings on first thing in the morning, before you get out of bed. Keep them on for as long as your health care provider advises. When you are wearing your stockings:  Keep them as smooth as possible. Do not allow them to bunch up. It is especially important to prevent the stockings from bunching up around your toes or behind your knees.  Do not roll the stockings downward and leave them rolled down. This can decrease blood flow to your leg.  Change them right away if they become wet or dirty.  When you take off your stockings, inspect your legs and feet. Anything that does not seem normal may require medical attention. Look for:  Open sores.  Red spots.  Swelling.  Information and tips  Do not stop wearing your compression stockings without talking to your health care provider first.  Wash your stockings every day with mild detergent in cold or warm water. Do not use bleach. Air-dry your stockings or dry them in a clothes dryer on low heat.  Replace your stockings every 3-6 months.  If skin moisturizing is part of your treatment plan, apply lotion or cream at night so that  your skin will be dry when you put on the stockings in the morning. It is harder to put the stockings on when you have lotion on your legs or feet. Contact a health care provider if: Remove your stockings and seek medical care if:  You have a feeling of pins and needles in your feet or legs.  You have any new changes in your skin.  You have skin lesions that are getting worse.  You have swelling or pain that is getting worse.  Get help right away if:  You have numbness or tingling in your lower legs that does not get better right after you take the stockings off.  Your toes or feet become cold and blue.  You develop open sores or red spots on your legs that do not go away.  You see or feel a warm spot on your leg.  You have new swelling or soreness in your leg.  You are short of breath or you have chest pain for no reason.  You have a rapid or irregular heartbeat.  You feel light-headed or dizzy. This information is not intended to replace advice given to you by your health care provider. Make sure you discuss any questions you have with your health care provider. Document Released: 01/29/2009 Document Revised: 09/01/2015 Document Reviewed: 03/11/2014 Elsevier Interactive Patient Education  2018 [employer]  Chronic Venous Insufficiency Chronic venous insufficiency, also called venous   stasis, is a condition that prevents blood from being pumped effectively through the veins in your legs. Blood may no longer be pumped effectively from the legs back to the heart. This condition can range from mild to severe. With proper treatment, you should be able to continue with an active life. What are the causes? Chronic venous insufficiency occurs when the vein walls become stretched, weakened, or damaged, or when valves within the vein are damaged. Some common causes of this include:  High blood pressure inside the veins (venous hypertension).  Increased blood pressure in the leg  veins from long periods of sitting or standing.  A blood clot that blocks blood flow in a vein (deep vein thrombosis, DVT).  Inflammation of a vein (phlebitis) that causes a blood clot to form.  Tumors in the pelvis that cause blood to back up.  What increases the risk? The following factors may make you more likely to develop this condition:  Having a family history of this condition.  Obesity.  Pregnancy.  Living without enough physical activity or exercise (sedentary lifestyle).  Smoking.  Having a job that requires long periods of standing or sitting in one place.  Being a certain age. Women in their 40s and 50s and men in their 70s are more likely to develop this condition.  What are the signs or symptoms? Symptoms of this condition include:  Veins that are enlarged, bulging, or twisted (varicose veins).  Skin breakdown or ulcers.  Reddened or discolored skin on the front of the leg.  Brown, smooth, tight, and painful skin just above the ankle, usually on the inside of the leg (lipodermatosclerosis).  Swelling.  How is this diagnosed? This condition may be diagnosed based on:  Your medical history.  A physical exam.  Tests, such as: ? A procedure that creates an image of a blood vessel and nearby organs and provides information about blood flow through the blood vessel (duplex ultrasound). ? A procedure that tests blood flow (plethysmography). ? A procedure to look at the veins using X-ray and dye (venogram).  How is this treated? The goals of treatment are to help you return to an active life and to minimize pain or disability. Treatment depends on the severity of your condition, and it may include:  Wearing compression stockings. These can help relieve symptoms and help prevent your condition from getting worse. However, they do not cure the condition.  Sclerotherapy. This is a procedure involving an injection of a material that "dissolves" damaged  veins.  Surgery. This may involve: ? Removing a diseased vein (vein stripping). ? Cutting off blood flow through the vein (laser ablation surgery). ? Repairing a valve.  Follow these instructions at home:  Wear compression stockings as told by your health care provider. These stockings help to prevent blood clots and reduce swelling in your legs.  Take over-the-counter and prescription medicines only as told by your health care provider.  Stay active by exercising, walking, or doing different activities. Ask your health care provider what activities are safe for you and how much exercise you need.  Drink enough fluid to keep your urine clear or pale yellow.  Do not use any products that contain nicotine or tobacco, such as cigarettes and e-cigarettes. If you need help quitting, ask your health care provider.  Keep all follow-up visits as told by your health care provider. This is important. Contact a health care provider if:  You have redness, swelling, or more pain in the   affected area.  You see a red streak or line that extends up or down from the affected area.  You have skin breakdown or a loss of skin in the affected area, even if the breakdown is small.  You get an injury in the affected area. Get help right away if:  You get an injury and an open wound in the affected area.  You have severe pain that does not get better with medicine.  You have sudden numbness or weakness in the foot or ankle below the affected area, or you have trouble moving your foot or ankle.  You have a fever and you have worse or persistent symptoms.  You have chest pain.  You have shortness of breath. Summary  Chronic venous insufficiency, also called venous stasis, is a condition that prevents blood from being pumped effectively through the veins in your legs.  Chronic venous insufficiency occurs when the vein walls become stretched, weakened, or damaged, or when valves within the vein are  damaged.  Treatment for this condition depends on how severe your condition is, and it may involve wearing compression stockings or having a procedure.  Make sure you stay active by exercising, walking, or doing different activities. Ask your health care provider what activities are safe for you and how much exercise you need. This information is not intended to replace advice given to you by your health care provider. Make sure you discuss any questions you have with your health care provider. Document Released: 08/07/2006 Document Revised: 02/21/2016 Document Reviewed: 02/21/2016 Elsevier Interactive Patient Education  2017 [employer]  

## 2017-10-31 NOTE — Progress Notes (Signed)
Name: Rachael Gonzales   MRN: [id number]    DOB: 1965-12-06   Date:10/31/2017       Progress Note  Subjective  Chief Complaint  Chief Complaint  Patient presents with  . Leg Pain    bilateral leg pain- get "really red at night and then goes away"- happens with physical activity    Leg Pain   The incident occurred more than 1 week ago. There was no injury mechanism. The pain is present in the left leg and right leg. The pain is at a severity of 5/10. The pain is moderate. The pain has been fluctuating since onset. Pertinent negatives include no inability to bear weight, loss of motion, loss of sensation, muscle weakness, numbness or tingling. Associated symptoms comments: "flamed". The symptoms are aggravated by palpation and movement. She has tried nothing for the symptoms. The treatment provided moderate relief.    No problem-specific Assessment & Plan notes found for this encounter.   Past Medical History:  Diagnosis Date  . Allergy   . Depression   . Diverticulitis   . Headache    sinus  . Irritable bowel     Past Surgical History:  Procedure Laterality Date  . BACK SURGERY  04/17/1990   L5-S1  . CESAREAN SECTION    . COLONOSCOPY WITH PROPOFOL N/A 03/03/2016   Procedure: COLONOSCOPY WITH PROPOFOL;  Surgeon: Lucilla Lame, MD;  Location: [place];  Service: Endoscopy;  Laterality: N/A;  . ESOPHAGOGASTRODUODENOSCOPY (EGD) WITH PROPOFOL N/A 03/03/2016   Procedure: ESOPHAGOGASTRODUODENOSCOPY (EGD) WITH PROPOFOL;  Surgeon: Lucilla Lame, MD;  Location: [place];  Service: Endoscopy;  Laterality: N/A;  . POLYPECTOMY  03/03/2016   Procedure: POLYPECTOMY;  Surgeon: Lucilla Lame, MD;  Location: [place];  Service: Endoscopy;;  gastric biopsy Descending colon polyp.     Family History  Problem Relation Age of Onset  . COPD Mother   . Asthma Mother   . Cancer Father   . Sudden death Neg Hx   . Hypertension Neg Hx   . Heart attack Neg Hx   .  Diabetes Neg Hx   . Hyperlipidemia Neg Hx   . Breast cancer Neg Hx     Social History   Socioeconomic History  . Marital status: Married    Spouse name: Not on file  . Number of children: Not on file  . Years of education: Not on file  . Highest education level: Not on file  Occupational History  . Not on file  Social Needs  . Financial resource strain: Not on file  . Food insecurity:    Worry: Not on file    Inability: Not on file  . Transportation needs:    Medical: Not on file    Non-medical: Not on file  Tobacco Use  . Smoking status: Former Smoker    Last attempt to quit: 04/18/2011    Years since quitting: 6.5  . Smokeless tobacco: Never Used  Substance and Sexual Activity  . Alcohol use: Yes    Alcohol/week: 1.8 oz    Types: 3 Shots of liquor per week  . Drug use: No  . Sexual activity: Yes  Lifestyle  . Physical activity:    Days per week: Not on file    Minutes per session: Not on file  . Stress: Not on file  Relationships  . Social connections:    Talks on phone: Not on file    Gets together: Not on file  Attends religious service: Not on file    Active member of club or organization: Not on file    Attends meetings of clubs or organizations: Not on file    Relationship status: Not on file  . Intimate partner violence:    Fear of current or ex partner: Not on file    Emotionally abused: Not on file    Physically abused: Not on file    Forced sexual activity: Not on file  Other Topics Concern  . Not on file  Social History Narrative  . Not on file    Allergies  Allergen Reactions  . Iodinated Diagnostic Agents Hives    CT A/P w on 01/10/16. Itching and hives.    Outpatient Medications Prior to Visit  Medication Sig Dispense Refill  . Clobetasol Prop Emollient Base (CLOBETASOL PROPIONATE E) 0.05 % emollient cream Apply 1 application topically 2 (two) times daily. 30 g 0  . dicyclomine (BENTYL) 10 MG capsule Take 1 capsule (10 mg total) by  mouth 3 (three) times daily. 90 capsule 11  . fluticasone (FLONASE) 50 MCG/ACT nasal spray Place 2 sprays into both nostrils daily. 16 g 0  . loratadine (CLARITIN) 10 MG tablet Take 1 tablet by mouth 1 day or 1 dose.    . Multiple Vitamins-Minerals (MULTIVITAMIN & MINERAL) LIQD Take 1 tablet by mouth 1 day or 1 dose. pill    . pantoprazole (PROTONIX) 40 MG tablet Take 1 tablet (40 mg total) by mouth daily. 90 tablet 3   No facility-administered medications prior to visit.     Review of Systems  Constitutional: Negative for chills, fever, malaise/fatigue and weight loss.  HENT: Negative for ear discharge, ear pain and sore throat.   Eyes: Negative for blurred vision.  Respiratory: Negative for cough, sputum production, shortness of breath and wheezing.   Cardiovascular: Negative for chest pain, palpitations and leg swelling.  Gastrointestinal: Negative for abdominal pain, blood in stool, constipation, diarrhea, heartburn, melena and nausea.  Genitourinary: Negative for dysuria, frequency, hematuria and urgency.  Musculoskeletal: Negative for back pain, joint pain, myalgias and neck pain.  Skin: Negative for rash.  Neurological: Negative for dizziness, tingling, sensory change, focal weakness, numbness and headaches.  Endo/Heme/Allergies: Negative for environmental allergies and polydipsia. Does not bruise/bleed easily.  Psychiatric/Behavioral: Negative for depression and suicidal ideas. The patient is not nervous/anxious and does not have insomnia.      Objective  Vitals:   10/31/17 0827  BP: 120/80  Pulse: 80  Weight: 294 lb (133.4 kg)  Height: 5\' 9"  (1.753 m)    Physical Exam  Constitutional: No distress.  HENT:  Head: Normocephalic and atraumatic.  Right Ear: External ear normal.  Left Ear: External ear normal.  Nose: Nose normal.  Mouth/Throat: Oropharynx is clear and moist.  Eyes: Pupils are equal, round, and reactive to light. Conjunctivae and EOM are normal. Right eye  exhibits no discharge. Left eye exhibits no discharge.  Neck: Normal range of motion. Neck supple. No JVD present. No thyromegaly present.  Cardiovascular: Normal rate, regular rhythm, normal heart sounds and intact distal pulses. Exam reveals no gallop, no friction rub and no decreased pulses.  No murmur heard. Varicosities noted with tender over multiple surface varicosities.   Pulmonary/Chest: Effort normal and breath sounds normal.  Abdominal: Soft. Bowel sounds are normal. She exhibits no mass. There is no tenderness. There is no guarding.  Musculoskeletal: Normal range of motion. She exhibits no edema.  Lymphadenopathy:    She has no  cervical adenopathy.  Neurological: She is alert. She has normal reflexes.  Skin: Skin is warm and dry. She is not diaphoretic.  Nursing note and vitals reviewed.     Assessment & Plan  Problem List Items Addressed This Visit    None    Visit Diagnoses    Venous insufficiency    -  Primary   Patient with history of evening leg edema. Wears otc compression stockings. Will consult vein and vascular to evaluate and treat as needed.   Relevant Orders   Ambulatory referral to Vascular Surgery   Phlebitis       Continue antiinflammatory of choice with warm compresses as necessary.      No orders of the defined types were placed in this encounter.     Dr. [facility]  10/31/17

## 2017-11-20 ENCOUNTER — Encounter (INDEPENDENT_AMBULATORY_CARE_PROVIDER_SITE_OTHER): Payer: Self-pay | Admitting: Vascular Surgery

## 2017-11-20 ENCOUNTER — Ambulatory Visit (INDEPENDENT_AMBULATORY_CARE_PROVIDER_SITE_OTHER): Payer: BLUE CROSS/BLUE SHIELD | Admitting: Vascular Surgery

## 2017-11-20 VITALS — BP 128/75 | HR 71 | Resp 17 | Ht 69.5 in | Wt 296.0 lb

## 2017-11-20 DIAGNOSIS — K2100 Gastro-esophageal reflux disease with esophagitis, without bleeding: Secondary | ICD-10-CM

## 2017-11-20 DIAGNOSIS — M7989 Other specified soft tissue disorders: Secondary | ICD-10-CM

## 2017-11-20 DIAGNOSIS — Z6841 Body Mass Index (BMI) 40.0 and over, adult: Secondary | ICD-10-CM

## 2017-11-20 DIAGNOSIS — R600 Localized edema: Secondary | ICD-10-CM | POA: Diagnosis not present

## 2017-11-20 NOTE — Assessment & Plan Note (Signed)
We discussed the multifactorial nature of leg swelling and the pain resultant from that.  I have had a long discussion with the patient regarding swelling and why it  causes symptoms.  Patient will begin wearing graduated compression stockings class 1 (20-30 mmHg) on a daily basis a prescription was given. The patient will  beginning wearing the stockings first thing in the morning and removing them in the evening. The patient is instructed specifically not to sleep in the stockings.   In addition, behavioral modification will be initiated.  This will include frequent elevation, use of over the counter pain medications and exercise such as walking.  I have reviewed systemic causes for chronic edema such as liver, kidney and cardiac etiologies.  The patient denies problems with these organ systems.    Consideration for a lymph pump will also be made based upon the effectiveness of conservative therapy.  This would help to improve the edema control and prevent sequela such as ulcers and infections   Patient should undergo duplex ultrasound of the venous system to ensure that DVT or reflux is not present.  The patient will follow-up with me after the ultrasound.

## 2017-11-20 NOTE — Assessment & Plan Note (Signed)
Continue PPI as already ordered, this medication has been reviewed and there are no changes at this time. Avoidence of caffeine and alcohol Moderate elevation of the head of the bed  

## 2017-11-20 NOTE — Patient Instructions (Signed)

## 2017-11-20 NOTE — Progress Notes (Signed)
Patient ID: Rachael Gonzales, female   DOB: 1965-09-19, 52 y.o.   MRN: [id number]  Chief Complaint  Patient presents with  . New Patient (Initial Visit)    ref for venous insufficiency    HPI Rachael Gonzales is a 52 y.o. female.  I am asked to see the patient by Dr. Ronnald Ramp for evaluation of lower extremity vascular disease.  The patient reports over the past several months noticing worsening discoloration, pain and swelling in her legs.  Both legs are affected.  This has become a much more noticeable problem without a clear inciting event or causative factor.  When she and her wife recently moved, being on her feet for several days caused a marked swelling and discoloration in her they were very concerned about phlebitis, but the swelling did get better.  She tried compression stockings without much improvement.  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  She reports no chest pain or shortness of breath.  No family history of phlebitis although her dad did have vein stripping.   Past Medical History:  Diagnosis Date  . Allergy   . Depression   . Diverticulitis   . Headache    sinus  . Irritable bowel     Past Surgical History:  Procedure Laterality Date  . BACK SURGERY  04/17/1990   L5-S1  . CESAREAN SECTION    . COLONOSCOPY WITH PROPOFOL N/A 03/03/2016   Procedure: COLONOSCOPY WITH PROPOFOL;  Surgeon: Lucilla Lame, MD;  Location: [place];  Service: Endoscopy;  Laterality: N/A;  . ESOPHAGOGASTRODUODENOSCOPY (EGD) WITH PROPOFOL N/A 03/03/2016   Procedure: ESOPHAGOGASTRODUODENOSCOPY (EGD) WITH PROPOFOL;  Surgeon: Lucilla Lame, MD;  Location: [place];  Service: Endoscopy;  Laterality: N/A;  . POLYPECTOMY  03/03/2016   Procedure: POLYPECTOMY;  Surgeon: Lucilla Lame, MD;  Location: [place];  Service: Endoscopy;;  gastric biopsy Descending colon polyp.     Family History    Problem Relation Age of Onset  . COPD Mother   . Asthma Mother   . Cancer Father   . Sudden death Neg Hx   . Hypertension Neg Hx   . Heart attack Neg Hx   . Diabetes Neg Hx   . Hyperlipidemia Neg Hx   . Breast cancer Neg Hx      Social History Social History   Tobacco Use  . Smoking status: Former Smoker    Last attempt to quit: 04/18/2011    Years since quitting: 6.5  . Smokeless tobacco: Never Used  Substance Use Topics  . Alcohol use: Yes    Alcohol/week: 1.8 oz    Types: 3 Shots of liquor per week  . Drug use: No     Allergies  Allergen Reactions  . Iodinated Diagnostic Agents Hives    CT A/P w on 01/10/16. Itching and hives.    Current Outpatient Medications  Medication Sig Dispense Refill  . Clobetasol Prop Emollient Base (CLOBETASOL PROPIONATE E) 0.05 % emollient cream Apply 1 application topically 2 (two) times daily. 30 g 0  . dicyclomine (BENTYL) 10 MG capsule Take 1 capsule (10 mg total) by mouth 3 (three) times daily. 90 capsule 11  . fluticasone (FLONASE) 50 MCG/ACT nasal spray Place 2 sprays into both nostrils daily. 16 g 0  . loratadine (CLARITIN) 10 MG tablet Take 1 tablet by mouth 1 day or 1  dose.    . Multiple Vitamins-Minerals (MULTIVITAMIN & MINERAL) LIQD Take 1 tablet by mouth 1 day or 1 dose. pill    . pantoprazole (PROTONIX) 40 MG tablet Take 1 tablet (40 mg total) by mouth daily. 90 tablet 3   No current facility-administered medications for this visit.       REVIEW OF SYSTEMS (Negative unless checked)  Constitutional: [] Weight loss  [] Fever  [] Chills Cardiac: [] Chest pain   [] Chest pressure   [] Palpitations   [] Shortness of breath when laying flat   [] Shortness of breath at rest   [] Shortness of breath with exertion. Vascular:  [x] Pain in legs with walking   [x] Pain in legs at rest   [] Pain in legs when laying flat   [] Claudication   [] Pain in feet when walking  [] Pain in feet at rest  [] Pain in feet when laying flat   [] History of DVT    [] Phlebitis   [x] Swelling in legs   [] Varicose veins   [] Non-healing ulcers Pulmonary:   [] Uses home oxygen   [] Productive cough   [] Hemoptysis   [] Wheeze  [] COPD   [] Asthma Neurologic:  [] Dizziness  [] Blackouts   [] Seizures   [] History of stroke   [] History of TIA  [] Aphasia   [] Temporary blindness   [] Dysphagia   [] Weakness or numbness in arms   [] Weakness or numbness in legs Musculoskeletal:  [x] Arthritis   [] Joint swelling   [] Joint pain   [] Low back pain Hematologic:  [] Easy bruising  [] Easy bleeding   [] Hypercoagulable state   [] Anemic  [] Hepatitis Gastrointestinal:  [] Blood in stool   [] Vomiting blood  [] Gastroesophageal reflux/heartburn   [] Abdominal pain Genitourinary:  [] Chronic kidney disease   [] Difficult urination  [] Frequent urination  [] Burning with urination   [] Hematuria Skin:  [] Rashes   [] Ulcers   [] Wounds Psychological:  [] History of anxiety   []  History of major depression.    Physical Exam BP 128/75 (BP Location: Right Arm)   Pulse 71   Resp 17   Ht 5' 9.5" (1.765 m)   Wt 296 lb (134.3 kg)   BMI 43.08 kg/m  Gen:  WD/WN, obese, NAD Head: [place]/AT, No temporalis wasting.  Ear/Nose/Throat: Hearing grossly intact, nares w/o erythema or drainage, oropharynx w/o Erythema/Exudate Eyes: Conjunctiva clear, sclera non-icteric  Neck: trachea midline.   Pulmonary:  Good air movement, respirations not labored, no use of accessory muscles Cardiac: RRR, no JVD Vascular:  Vessel Right Left                              PT  1+ palpable  1+ palpable  DP Palpable Palpable   Gastrointestinal: soft, non-tender/non-distended.  Musculoskeletal: M/S 5/5 throughout.  Extremities without ischemic changes.  No deformity or atrophy.  1-2+ bilateral lower extremity edema.  Mild stasis dermatitis bilaterally Neurologic: Sensation grossly intact in extremities.  Symmetrical.  Speech is fluent. Motor exam as listed above. Psychiatric: Judgment intact, Mood & affect appropriate for pt's  clinical situation. Dermatologic: No rashes or ulcers noted.  No cellulitis or open wounds.    Radiology No results found.  Labs No results found for this or any previous visit (from the past 2160 hour(s)).  Assessment/Plan:  Reflux esophagitis Continue PPI as already ordered, this medication has been reviewed and there are no changes at this time.  Avoidence of caffeine and alcohol  Moderate elevation of the head of the bed   BMI 40.0-44.9, adult [employer]) She does understand that weight  loss would help her leg swelling and pain.  Swelling of limb We discussed the multifactorial nature of leg swelling and the pain resultant from that.  I have had a long discussion with the patient regarding swelling and why it  causes symptoms.  Patient will begin wearing graduated compression stockings class 1 (20-30 mmHg) on a daily basis a prescription was given. The patient will  beginning wearing the stockings first thing in the morning and removing them in the evening. The patient is instructed specifically not to sleep in the stockings.   In addition, behavioral modification will be initiated.  This will include frequent elevation, use of over the counter pain medications and exercise such as walking.  I have reviewed systemic causes for chronic edema such as liver, kidney and cardiac etiologies.  The patient denies problems with these organ systems.    Consideration for a lymph pump will also be made based upon the effectiveness of conservative therapy.  This would help to improve the edema control and prevent sequela such as ulcers and infections   Patient should undergo duplex ultrasound of the venous system to ensure that DVT or reflux is not present.  The patient will follow-up with me after the ultrasound.        Leotis Pain 11/20/2017, 4:38 PM   This note was created with Dragon medical transcription system.  Any errors from dictation are unintentional.

## 2017-11-20 NOTE — Assessment & Plan Note (Signed)
She does understand that weight loss would help her leg swelling and pain.

## 2017-11-23 ENCOUNTER — Other Ambulatory Visit: Payer: Self-pay | Admitting: Family Medicine

## 2017-11-23 ENCOUNTER — Other Ambulatory Visit: Payer: Self-pay | Admitting: Gastroenterology

## 2017-12-05 ENCOUNTER — Ambulatory Visit (INDEPENDENT_AMBULATORY_CARE_PROVIDER_SITE_OTHER): Payer: BLUE CROSS/BLUE SHIELD | Admitting: Nurse Practitioner

## 2017-12-05 ENCOUNTER — Encounter (INDEPENDENT_AMBULATORY_CARE_PROVIDER_SITE_OTHER): Payer: BLUE CROSS/BLUE SHIELD

## 2017-12-20 ENCOUNTER — Other Ambulatory Visit: Payer: Self-pay

## 2017-12-20 DIAGNOSIS — N6459 Other signs and symptoms in breast: Secondary | ICD-10-CM

## 2017-12-21 ENCOUNTER — Telehealth: Payer: Self-pay

## 2017-12-21 NOTE — Telephone Encounter (Signed)
Pt has lumpy breasts; has had a lot of mammograms and ultrasounds in the past.  Has found a new lump in right breast.  Doesn't know whether to see her primary or [place].  [phone]  Adv to see [place].  Tx'd to SP to schedule.  Is also overdue for annual.

## 2017-12-24 ENCOUNTER — Encounter: Payer: Self-pay | Admitting: Obstetrics and Gynecology

## 2017-12-24 ENCOUNTER — Ambulatory Visit: Payer: BLUE CROSS/BLUE SHIELD | Admitting: Obstetrics and Gynecology

## 2017-12-24 VITALS — BP 116/80 | HR 80 | Ht 69.5 in | Wt 298.0 lb

## 2017-12-24 DIAGNOSIS — N6310 Unspecified lump in the right breast, unspecified quadrant: Secondary | ICD-10-CM | POA: Diagnosis not present

## 2017-12-24 NOTE — Patient Instructions (Signed)
I value your feedback and entrusting us with your care. If you get a [facility] patient survey, I would appreciate you taking the time to let us know about your experience today. Thank you! 

## 2017-12-24 NOTE — Progress Notes (Signed)
Rachael Patch, MD   Chief Complaint  Patient presents with  . Breast exam    right breast, noticed on wed night, no pain.    HPI:      [name] is a 52 y.o. G1P1001 who LMP was Patient's last menstrual period was 12/23/2017 (exact date)., presents today for new RT breast mass, palpated on SBE last wk. Started period a few days later but mass persists. Hx of benign breast masses bilat. Drinks caffeine.   Last mammo 09/20/17, 2/18 WNL but had 2 LT breast masses 11/18 with cat 2 mammo and u/s. F/u mammo 6/19 WNL.  No known FH breast/ovar ca.  Annual past due/ last pap 11/17.  Past Medical History:  Diagnosis Date  . Allergy   . Depression   . Diverticulitis   . Headache    sinus  . Irritable bowel     Past Surgical History:  Procedure Laterality Date  . BACK SURGERY  04/17/1990   L5-S1  . CESAREAN SECTION    . COLONOSCOPY WITH PROPOFOL N/A 03/03/2016   Procedure: COLONOSCOPY WITH PROPOFOL;  Surgeon: Lucilla Lame, MD;  Location: [place];  Service: Endoscopy;  Laterality: N/A;  . ESOPHAGOGASTRODUODENOSCOPY (EGD) WITH PROPOFOL N/A 03/03/2016   Procedure: ESOPHAGOGASTRODUODENOSCOPY (EGD) WITH PROPOFOL;  Surgeon: Lucilla Lame, MD;  Location: [place];  Service: Endoscopy;  Laterality: N/A;  . POLYPECTOMY  03/03/2016   Procedure: POLYPECTOMY;  Surgeon: Lucilla Lame, MD;  Location: [place];  Service: Endoscopy;;  gastric biopsy Descending colon polyp.     Family History  Problem Relation Age of Onset  . COPD Mother   . Asthma Mother   . Cancer Father        ? primary site (rare)  . Cancer Maternal Grandmother        ? primary site  . Sudden death Neg Hx   . Hypertension Neg Hx   . Heart attack Neg Hx   . Diabetes Neg Hx   . Hyperlipidemia Neg Hx   . Breast cancer Neg Hx     Social History   Socioeconomic History  . Marital status: Married    Spouse name: Not on file  . Number of children: Not on file  . Years of  education: Not on file  . Highest education level: Not on file  Occupational History  . Not on file  Social Needs  . Financial resource strain: Not on file  . Food insecurity:    Worry: Not on file    Inability: Not on file  . Transportation needs:    Medical: Not on file    Non-medical: Not on file  Tobacco Use  . Smoking status: Former Smoker    Last attempt to quit: 04/18/2011    Years since quitting: 6.6  . Smokeless tobacco: Never Used  Substance and Sexual Activity  . Alcohol use: Yes    Alcohol/week: 3.0 standard drinks    Types: 3 Shots of liquor per week  . Drug use: No  . Sexual activity: Yes    Birth control/protection: None  Lifestyle  . Physical activity:    Days per week: Not on file    Minutes per session: Not on file  . Stress: Not on file  Relationships  . Social connections:    Talks on phone: Not on file    Gets together: Not on file    Attends religious service: Not on file    Active  member of club or organization: Not on file    Attends meetings of clubs or organizations: Not on file    Relationship status: Not on file  . Intimate partner violence:    Fear of current or ex partner: Not on file    Emotionally abused: Not on file    Physically abused: Not on file    Forced sexual activity: Not on file  Other Topics Concern  . Not on file  Social History Narrative  . Not on file    Outpatient Medications Prior to Visit  Medication Sig Dispense Refill  . Clobetasol Prop Emollient Base (CLOBETASOL PROPIONATE E) 0.05 % emollient cream Apply 1 application topically 2 (two) times daily. 30 g 0  . dicyclomine (BENTYL) 10 MG capsule Take 1 capsule (10 mg total) by mouth 3 (three) times daily. 90 capsule 11  . fluticasone (FLONASE) 50 MCG/ACT nasal spray Place 2 sprays into both nostrils daily. 16 g 0  . ibuprofen (ADVIL,MOTRIN) 200 MG tablet Take by mouth.    . loratadine (CLARITIN) 10 MG tablet Take 1 tablet by mouth 1 day or 1 dose.    . Multiple  Vitamins-Minerals (MULTIVITAMIN & MINERAL) LIQD Take 1 tablet by mouth 1 day or 1 dose. pill    . nystatin [facility]) powder     . pantoprazole (PROTONIX) 40 MG tablet take 1 tablet by mouth once daily 30 tablet 6   No facility-administered medications prior to visit.       ROS:  Review of Systems  Constitutional: Negative for fatigue, fever and unexpected weight change.  Respiratory: Negative for cough, shortness of breath and wheezing.   Cardiovascular: Negative for chest pain, palpitations and leg swelling.  Gastrointestinal: Negative for blood in stool, constipation, diarrhea, nausea and vomiting.  Endocrine: Negative for cold intolerance, heat intolerance and polyuria.  Genitourinary: Negative for dyspareunia, dysuria, flank pain, frequency, genital sores, hematuria, menstrual problem, pelvic pain, urgency, vaginal bleeding, vaginal discharge and vaginal pain.  Musculoskeletal: Negative for back pain, joint swelling and myalgias.  Skin: Negative for rash.  Neurological: Negative for dizziness, syncope, light-headedness, numbness and headaches.  Hematological: Negative for adenopathy.  Psychiatric/Behavioral: Negative for agitation, confusion, sleep disturbance and suicidal ideas. The patient is not nervous/anxious.   BREAST: mass   OBJECTIVE:   Vitals:  BP 116/80   Pulse 80   Ht 5' 9.5" (1.765 m)   Wt 298 lb (135.2 kg)   LMP 12/23/2017 (Exact Date)   BMI 43.38 kg/m   Physical Exam  Constitutional: She is oriented to person, place, and time.  Pulmonary/Chest: Right breast exhibits mass and tenderness. Right breast exhibits no inverted nipple, no nipple discharge and no skin change. Left breast exhibits no inverted nipple, no mass, no nipple discharge, no skin change and no tenderness. Breasts are symmetrical.  ~1 CM FIRM, MOBILE, AREA; GRAINY, POSS MASS 8:30 POSITION    Lymphadenopathy:    She has no axillary adenopathy.  Neurological: She is alert and oriented to  person, place, and time.  Psychiatric: Judgment normal.  Vitals reviewed.   Assessment/Plan: Breast mass, right - 8:30 position. ~1 cm firm, grainy, possible mass. Check mammo/u/s. If neg, most likely prominent tissue.      Return in about 1 month (around 78/08/8848) for annual.  Zarra Geffert B. Geanine Vandekamp, PA-C 12/24/2017 9:28 AM

## 2017-12-28 ENCOUNTER — Ambulatory Visit (INDEPENDENT_AMBULATORY_CARE_PROVIDER_SITE_OTHER): Payer: BLUE CROSS/BLUE SHIELD | Admitting: Vascular Surgery

## 2017-12-28 ENCOUNTER — Encounter (INDEPENDENT_AMBULATORY_CARE_PROVIDER_SITE_OTHER): Payer: Self-pay | Admitting: Vascular Surgery

## 2017-12-28 ENCOUNTER — Ambulatory Visit (INDEPENDENT_AMBULATORY_CARE_PROVIDER_SITE_OTHER): Payer: BLUE CROSS/BLUE SHIELD

## 2017-12-28 VITALS — BP 134/80 | HR 65 | Resp 18 | Ht 69.5 in | Wt 296.0 lb

## 2017-12-28 DIAGNOSIS — M7989 Other specified soft tissue disorders: Secondary | ICD-10-CM

## 2017-12-28 DIAGNOSIS — Z6841 Body Mass Index (BMI) 40.0 and over, adult: Secondary | ICD-10-CM

## 2017-12-28 DIAGNOSIS — I8311 Varicose veins of right lower extremity with inflammation: Secondary | ICD-10-CM | POA: Insufficient documentation

## 2017-12-28 DIAGNOSIS — R600 Localized edema: Secondary | ICD-10-CM | POA: Diagnosis not present

## 2017-12-28 DIAGNOSIS — K2100 Gastro-esophageal reflux disease with esophagitis, without bleeding: Secondary | ICD-10-CM

## 2017-12-28 DIAGNOSIS — I8312 Varicose veins of left lower extremity with inflammation: Secondary | ICD-10-CM

## 2017-12-28 NOTE — Progress Notes (Signed)
MRN : [id number]  Rachael Gonzales is a 52 y.o. (1965/08/18) female who presents with chief complaint of  Chief Complaint  Patient presents with  . Follow-up    bil reflux ultrasound   .  History of Present Illness: Patient returns in follow up for leg swelling and pain.  Compression stockings and elevation have helped her swelling some and the pain some, but she still has some pain and swelling on a daily basis.  She also reports previous histories of bleeding from varicosities around her ankle when she was shaving or in the shower.  These are worse on the right leg than the left.  Her venous duplex today demonstrated no evidence of DVT or superficial thrombophlebitis.  There is reflux in the left common femoral vein.  There is no evidence of superficial venous reflux in either leg.   Past Medical History:  Diagnosis Date  . Allergy   . Depression   . Diverticulitis   . Headache    sinus  . Irritable bowel          Past Surgical History:  Procedure Laterality Date  . BACK SURGERY  04/17/1990   L5-S1  . CESAREAN SECTION    . COLONOSCOPY WITH PROPOFOL N/A 03/03/2016   Procedure: COLONOSCOPY WITH PROPOFOL;  Surgeon: Lucilla Lame, MD;  Location: [place];  Service: Endoscopy;  Laterality: N/A;  . ESOPHAGOGASTRODUODENOSCOPY (EGD) WITH PROPOFOL N/A 03/03/2016   Procedure: ESOPHAGOGASTRODUODENOSCOPY (EGD) WITH PROPOFOL;  Surgeon: Lucilla Lame, MD;  Location: [place];  Service: Endoscopy;  Laterality: N/A;  . POLYPECTOMY  03/03/2016   Procedure: POLYPECTOMY;  Surgeon: Lucilla Lame, MD;  Location: [place];  Service: Endoscopy;;  gastric biopsy Descending colon polyp.          Family History  Problem Relation Age of Onset  . COPD Mother   . Asthma Mother   . Cancer Father   . Sudden death Neg Hx   . Hypertension Neg Hx   . Heart attack Neg Hx   . Diabetes Neg Hx   . Hyperlipidemia Neg Hx   . Breast cancer Neg Hx        Social History Social History        Tobacco Use  . Smoking status: Former Smoker    Last attempt to quit: 04/18/2011    Years since quitting: 6.5  . Smokeless tobacco: Never Used  Substance Use Topics  . Alcohol use: Yes    Alcohol/week: 1.8 oz    Types: 3 Shots of liquor per week  . Drug use: No          Allergies  Allergen Reactions  . Iodinated Diagnostic Agents Hives    CT A/P w on 01/10/16. Itching and hives.          Current Outpatient Medications  Medication Sig Dispense Refill  . Clobetasol Prop Emollient Base (CLOBETASOL PROPIONATE E) 0.05 % emollient cream Apply 1 application topically 2 (two) times daily. 30 g 0  . dicyclomine (BENTYL) 10 MG capsule Take 1 capsule (10 mg total) by mouth 3 (three) times daily. 90 capsule 11  . fluticasone (FLONASE) 50 MCG/ACT nasal spray Place 2 sprays into both nostrils daily. 16 g 0  . loratadine (CLARITIN) 10 MG tablet Take 1 tablet by mouth 1 day or 1 dose.    . Multiple Vitamins-Minerals (MULTIVITAMIN & MINERAL) LIQD Take 1 tablet by mouth 1 day or 1 dose. pill    . pantoprazole (PROTONIX)  40 MG tablet Take 1 tablet (40 mg total) by mouth daily. 90 tablet 3   No current facility-administered medications for this visit.       REVIEW OF SYSTEMS (Negative unless checked)  Constitutional: [] Weight loss  [] Fever  [] Chills Cardiac: [] Chest pain   [] Chest pressure   [] Palpitations   [] Shortness of breath when laying flat   [] Shortness of breath at rest   [] Shortness of breath with exertion. Vascular:  [x] Pain in legs with walking   [x] Pain in legs at rest   [] Pain in legs when laying flat   [] Claudication   [] Pain in feet when walking  [] Pain in feet at rest  [] Pain in feet when laying flat   [] History of DVT   [] Phlebitis   [x] Swelling in legs   [] Varicose veins   [] Non-healing ulcers Pulmonary:   [] Uses home oxygen   [] Productive cough   [] Hemoptysis   [] Wheeze  [] COPD   [] Asthma Neurologic:   [] Dizziness  [] Blackouts   [] Seizures   [] History of stroke   [] History of TIA  [] Aphasia   [] Temporary blindness   [] Dysphagia   [] Weakness or numbness in arms   [] Weakness or numbness in legs Musculoskeletal:  [x] Arthritis   [] Joint swelling   [] Joint pain   [] Low back pain Hematologic:  [] Easy bruising  [] Easy bleeding   [] Hypercoagulable state   [] Anemic  [] Hepatitis Gastrointestinal:  [] Blood in stool   [] Vomiting blood  [] Gastroesophageal reflux/heartburn   [] Abdominal pain Genitourinary:  [] Chronic kidney disease   [] Difficult urination  [] Frequent urination  [] Burning with urination   [] Hematuria Skin:  [] Rashes   [] Ulcers   [] Wounds Psychological:  [] History of anxiety   []  History of major depression.   Marland Kitchen  Physical Examination  Vitals:   12/28/17 1427  BP: 134/80  Pulse: 65  Resp: 18  Weight: 296 lb (134.3 kg)  Height: 5' 9.5" (1.765 m)   Body mass index is 43.08 kg/m. Gen:  WD/WN, NAD,  Head: [place]/AT, No temporalis wasting. Ear/Nose/Throat: Hearing grossly intact, dentition good, trachea midline Eyes: Conjunctiva clear. Sclera non-icteric Neck: Supple. Trachea midline Pulmonary:  Good air movement, respirations not labored, no use of accessory muscles.  Cardiac: RRR, no JVD Vascular: Diffuse varicosities in the right lower extremity, scattered varicosities in the left lower extremity.  Mild stasis dermatitis changes present bilaterally Vessel Right Left  Radial Palpable Palpable                                   Musculoskeletal: M/S 5/5 throughout.  No deformity or atrophy.  1+ bilateral lower extremity edema. Neurologic: Sensation grossly intact in extremities.  Symmetrical.  Speech is fluent. Psychiatric: Judgment intact, Mood & affect appropriate for pt's clinical situation. Dermatologic: No rashes or ulcers noted.  No cellulitis or open wounds.      Labs No results found for this or any previous visit (from the past 2160 hour(s)).  Radiology No  results found.   Assessment/Plan Reflux esophagitis Continue PPI as already ordered, this medication has been reviewed and there are no changes at this time.  Avoidence of caffeine and alcohol  Moderate elevation of the head of the bed   BMI 40.0-44.9, adult ([place]) She does understand that weight loss would help her leg swelling and pain.  Swelling of limb Her venous duplex today demonstrated no evidence of DVT or superficial thrombophlebitis.  There is reflux in the left common femoral  vein.  There is no evidence of superficial venous reflux in either leg.  As for the swelling, I think she likely has some lymphedema from chronic scarring and lymphatic channels.  She does not have anything that would benefit from laser ablation.  I do think a lymphedema pump would be an excellent adjuvant therapy.  She should continue wearing her compression stockings, elevating her legs, and increasing her activity as tolerated.  I will recheck her in several months.  Varicose veins of both lower extremities with inflammation I think would be reasonable to consider sclerotherapy on the superficial varicosities that have had bleeding previously.  Risks and benefits of this were discussed and she is agreeable to proceed.    Leotis Pain, MD  12/28/2017 3:03 PM    This note was created with Dragon medical transcription system.  Any errors from dictation are purely unintentional

## 2017-12-28 NOTE — Patient Instructions (Signed)

## 2017-12-28 NOTE — Assessment & Plan Note (Signed)
I think would be reasonable to consider sclerotherapy on the superficial varicosities that have had bleeding previously.  Risks and benefits of this were discussed and she is agreeable to proceed.

## 2017-12-28 NOTE — Assessment & Plan Note (Signed)
Her venous duplex today demonstrated no evidence of DVT or superficial thrombophlebitis.  There is reflux in the left common femoral vein.  There is no evidence of superficial venous reflux in either leg.  As for the swelling, I think she likely has some lymphedema from chronic scarring and lymphatic channels.  She does not have anything that would benefit from laser ablation.  I do think a lymphedema pump would be an excellent adjuvant therapy.  She should continue wearing her compression stockings, elevating her legs, and increasing her activity as tolerated.  I will recheck her in several months.

## 2018-01-07 ENCOUNTER — Ambulatory Visit
Admission: RE | Admit: 2018-01-07 | Discharge: 2018-01-07 | Disposition: A | Discharge status: Home / Self Care | Payer: BLUE CROSS/BLUE SHIELD | Source: Ambulatory Visit | Attending: Obstetrics and Gynecology | Admitting: Obstetrics and Gynecology

## 2018-01-07 DIAGNOSIS — R928 Other abnormal and inconclusive findings on diagnostic imaging of breast: Secondary | ICD-10-CM | POA: Diagnosis not present

## 2018-01-07 DIAGNOSIS — N6489 Other specified disorders of breast: Secondary | ICD-10-CM | POA: Diagnosis not present

## 2018-01-07 DIAGNOSIS — N6310 Unspecified lump in the right breast, unspecified quadrant: Secondary | ICD-10-CM

## 2018-02-02 ENCOUNTER — Other Ambulatory Visit: Payer: Self-pay | Admitting: Gastroenterology

## 2018-02-06 ENCOUNTER — Ambulatory Visit
Admission: RE | Admit: 2018-02-06 | Discharge: 2018-02-06 | Disposition: A | Discharge status: Home / Self Care | Payer: BLUE CROSS/BLUE SHIELD | Source: Ambulatory Visit | Attending: Family Medicine | Admitting: Family Medicine

## 2018-02-06 ENCOUNTER — Ambulatory Visit: Payer: BLUE CROSS/BLUE SHIELD | Admitting: Family Medicine

## 2018-02-06 ENCOUNTER — Encounter: Payer: Self-pay | Admitting: Family Medicine

## 2018-02-06 VITALS — BP 124/70 | HR 80 | Ht 69.5 in | Wt 302.0 lb

## 2018-02-06 DIAGNOSIS — M5030 Other cervical disc degeneration, unspecified cervical region: Secondary | ICD-10-CM

## 2018-02-06 DIAGNOSIS — M542 Cervicalgia: Secondary | ICD-10-CM | POA: Diagnosis not present

## 2018-02-06 MED ORDER — MELOXICAM 15 MG PO TABS: 15.0000 mg | ORAL_TABLET | Freq: Every day | ORAL | 0 refills | Status: DC

## 2018-02-06 MED ORDER — CYCLOBENZAPRINE HCL 10 MG PO TABS: 10.0000 mg | ORAL_TABLET | Freq: Three times a day (TID) | ORAL | 0 refills | Status: DC | PRN

## 2018-02-06 NOTE — Progress Notes (Signed)
Date:  02/06/2018   Name:  Rachael Gonzales   DOB:  [birth year]   MRN:  [id number]   Chief Complaint: Neck Pain (having neck pain/ stiffness) Neck Pain   This is a chronic problem. The current episode started more than 1 year ago. The problem has been gradually worsening. Associated with: hyperextension. The pain is present in the midline. The pain is at a severity of 2/10. The pain is mild. The symptoms are aggravated by coughing, sneezing, twisting and bending. The pain is worse during the day. Stiffness is present all day. Associated symptoms include tingling. Pertinent negatives include no fever, headaches, numbness, paresis, photophobia, syncope or weakness. She has tried NSAIDs for the symptoms. The treatment provided moderate relief.     Review of Systems  Constitutional: Negative.  Negative for chills, fatigue, fever and unexpected weight change.  HENT: Negative for congestion, ear discharge, ear pain, rhinorrhea, sinus pressure, sneezing and sore throat.   Eyes: Negative for photophobia, pain, discharge, redness and itching.  Respiratory: Negative for cough, shortness of breath, wheezing and stridor.   Cardiovascular: Negative for syncope.  Gastrointestinal: Negative for abdominal pain, blood in stool, constipation, diarrhea, nausea and vomiting.  Endocrine: Negative for cold intolerance, heat intolerance, polydipsia, polyphagia and polyuria.  Genitourinary: Negative for dysuria, flank pain, frequency, hematuria, menstrual problem, pelvic pain, urgency, vaginal bleeding and vaginal discharge.  Musculoskeletal: Positive for neck pain. Negative for arthralgias, back pain and myalgias.  Skin: Negative for rash.  Allergic/Immunologic: Negative for environmental allergies and food allergies.  Neurological: Positive for tingling. Negative for dizziness, weakness, light-headedness, numbness and headaches.  Hematological: Negative for adenopathy. Does not bruise/bleed easily.    Psychiatric/Behavioral: Negative for dysphoric mood. The patient is not nervous/anxious.     Patient Active Problem List   Diagnosis Date Noted  . Varicose veins of both lower extremities with inflammation 12/28/2017  . Swelling of limb 11/20/2017  . BMI 40.0-44.9, adult ([place]) 07/19/2016  . Heartburn   . Reflux esophagitis   . Acute gastritis without hemorrhage   . Diverticulitis of colon (without mention of hemorrhage)(562.11)   . Benign neoplasm of descending colon   . Depression 12/30/2014  . Irritable bowel syndrome 08/14/2014  . Adult BMI 30+ 08/14/2014  . Acute situational disturbance 08/14/2014  . Lateral epicondylitis of left elbow 07/04/2013  . Heel pain, bilateral 05/26/2013    Allergies  Allergen Reactions  . Iodinated Diagnostic Agents Hives    CT A/P w on 01/10/16. Itching and hives.    Past Surgical History:  Procedure Laterality Date  . BACK SURGERY  04/17/1990   L5-S1  . CESAREAN SECTION    . COLONOSCOPY WITH PROPOFOL N/A 03/03/2016   Procedure: COLONOSCOPY WITH PROPOFOL;  Surgeon: Lucilla Lame, MD;  Location: [facility];  Service: Endoscopy;  Laterality: N/A;  . ESOPHAGOGASTRODUODENOSCOPY (EGD) WITH PROPOFOL N/A 03/03/2016   Procedure: ESOPHAGOGASTRODUODENOSCOPY (EGD) WITH PROPOFOL;  Surgeon: Lucilla Lame, MD;  Location: [facility];  Service: Endoscopy;  Laterality: N/A;  . POLYPECTOMY  03/03/2016   Procedure: POLYPECTOMY;  Surgeon: Lucilla Lame, MD;  Location: [facility];  Service: Endoscopy;;  gastric biopsy Descending colon polyp.     Social History   Tobacco Use  . Smoking status: Former Smoker    Last attempt to quit: 04/18/2011    Years since quitting: 6.8  . Smokeless tobacco: Never Used  Substance Use Topics  . Alcohol use: Yes    Alcohol/week: 3.0 standard drinks    Types:  3 Shots of liquor per week  . Drug use: No     Medication list has been reviewed and updated.  No outpatient medications have been marked as  taking for the 02/06/18 encounter (Office Visit) with [name], MD.    [facility] 2/9 Scores 10/31/2017 05/25/2015  PHQ - 2 Score 0 0  PHQ- 9 Score 1 -    Physical Exam  Constitutional: She is oriented to person, place, and time. She appears well-developed and well-nourished.  HENT:  Head: Normocephalic.  Right Ear: External ear normal.  Left Ear: External ear normal.  Mouth/Throat: Oropharynx is clear and moist.  Eyes: Pupils are equal, round, and reactive to light. Conjunctivae and EOM are normal. Lids are everted and swept, no foreign bodies found. Left eye exhibits no hordeolum. No foreign body present in the left eye. Right conjunctiva is not injected. Left conjunctiva is not injected. No scleral icterus.  Neck: Normal range of motion. Neck supple. No JVD present. No tracheal deviation present. No thyromegaly present.  Cardiovascular: Normal rate, regular rhythm, normal heart sounds and intact distal pulses. Exam reveals no gallop and no friction rub.  No murmur heard. Pulmonary/Chest: Effort normal and breath sounds normal. No respiratory distress. She has no wheezes. She has no rales.  Abdominal: Soft. She exhibits no mass. There is no hepatosplenomegaly. There is no tenderness. There is no rebound.  Musculoskeletal: Normal range of motion. She exhibits no edema or tenderness.  Lymphadenopathy:    She has no cervical adenopathy.  Neurological: She is alert and oriented to person, place, and time. She has normal strength. She displays normal reflexes. No sensory deficit. She exhibits normal muscle tone.  Skin: Skin is warm. No rash noted.  Psychiatric: She has a normal mood and affect. Her mood appears not anxious. She does not exhibit a depressed mood.  Nursing note and vitals reviewed.   BP 124/70   Pulse 80   Ht 5' 9.5" (1.765 m)   Wt (!) 302 lb (137 kg)   LMP 01/16/2018 (Approximate)   BMI 43.96 kg/m   Assessment and Plan:  1. DDD (degenerative disc disease),  cervical Prescribed meloxicam and cyclobenzaprine/ recheck in 4 weeks. Order PT and xray of neck - meloxicam (MOBIC) 15 MG tablet; Take 1 tablet (15 mg total) by mouth daily.  Dispense: 30 tablet; Refill: 0 - cyclobenzaprine (FLEXERIL) 10 MG tablet; Take 1 tablet (10 mg total) by mouth 3 (three) times daily as needed for muscle spasms.  Dispense: 30 tablet; Refill: 0 - Ambulatory referral to Physical Therapy - DG Cervical Spine Complete; Future   Dr. Otilio Miu [facility]  02/06/2018

## 2018-02-08 ENCOUNTER — Ambulatory Visit (INDEPENDENT_AMBULATORY_CARE_PROVIDER_SITE_OTHER): Payer: BLUE CROSS/BLUE SHIELD | Admitting: Vascular Surgery

## 2018-02-12 ENCOUNTER — Ambulatory Visit (INDEPENDENT_AMBULATORY_CARE_PROVIDER_SITE_OTHER): Payer: BLUE CROSS/BLUE SHIELD | Admitting: Vascular Surgery

## 2018-02-12 ENCOUNTER — Encounter (INDEPENDENT_AMBULATORY_CARE_PROVIDER_SITE_OTHER): Payer: Self-pay | Admitting: Vascular Surgery

## 2018-02-12 DIAGNOSIS — I8311 Varicose veins of right lower extremity with inflammation: Secondary | ICD-10-CM

## 2018-02-12 DIAGNOSIS — I8312 Varicose veins of left lower extremity with inflammation: Secondary | ICD-10-CM | POA: Diagnosis not present

## 2018-02-12 NOTE — Progress Notes (Signed)
Rachael Gonzales is a 52 y.o.female who presents with painful varicose veins of the right leg  Past Medical History:  Diagnosis Date  . Allergy   . Depression   . Diverticulitis   . Headache    sinus  . Irritable bowel     Past Surgical History:  Procedure Laterality Date  . BACK SURGERY  04/17/1990   L5-S1  . CESAREAN SECTION    . COLONOSCOPY WITH PROPOFOL N/A 03/03/2016   Procedure: COLONOSCOPY WITH PROPOFOL;  Surgeon: Lucilla Lame, MD;  Location: [place];  Service: Endoscopy;  Laterality: N/A;  . ESOPHAGOGASTRODUODENOSCOPY (EGD) WITH PROPOFOL N/A 03/03/2016   Procedure: ESOPHAGOGASTRODUODENOSCOPY (EGD) WITH PROPOFOL;  Surgeon: Lucilla Lame, MD;  Location: [place];  Service: Endoscopy;  Laterality: N/A;  . POLYPECTOMY  03/03/2016   Procedure: POLYPECTOMY;  Surgeon: Lucilla Lame, MD;  Location: [place];  Service: Endoscopy;;  gastric biopsy Descending colon polyp.     Current Outpatient Medications  Medication Sig Dispense Refill  . Clobetasol Prop Emollient Base (CLOBETASOL PROPIONATE E) 0.05 % emollient cream Apply 1 application topically 2 (two) times daily. 30 g 0  . cyclobenzaprine (FLEXERIL) 10 MG tablet Take 1 tablet (10 mg total) by mouth 3 (three) times daily as needed for muscle spasms. 30 tablet 0  . dicyclomine (BENTYL) 10 MG capsule Take 1 capsule (10 mg total) by mouth 3 (three) times daily. 90 capsule 11  . fluticasone (FLONASE) 50 MCG/ACT nasal spray Place 2 sprays into both nostrils daily. 16 g 0  . ibuprofen (ADVIL,MOTRIN) 200 MG tablet Take by mouth.    . loratadine (CLARITIN) 10 MG tablet Take 1 tablet by mouth 1 day or 1 dose.    . meloxicam (MOBIC) 15 MG tablet Take 1 tablet (15 mg total) by mouth daily. 30 tablet 0  . Multiple Vitamins-Minerals (MULTIVITAMIN & MINERAL) LIQD Take 1 tablet by mouth 1 day or 1 dose. pill    . nystatin [facility]) powder     . pantoprazole (PROTONIX) 40 MG tablet TAKE 1 TABLET BY MOUTH ONCE DAILY  180 tablet 0   No current facility-administered medications for this visit.     Allergies  Allergen Reactions  . Iodinated Diagnostic Agents Hives    CT A/P w on 01/10/16. Itching and hives.    Indication: Patient presents with symptomatic varicose veins of the right lower extremity.  Procedure: Foam sclerotherapy was performed on the right lower extremity. Using ultrasound guidance, 5 mL of foam Sotradecol was used to inject the varicosities of the right lower extremity. Compression wraps were placed. The patient tolerated the procedure well.

## 2018-02-21 DIAGNOSIS — M6281 Muscle weakness (generalized): Secondary | ICD-10-CM | POA: Diagnosis not present

## 2018-02-21 DIAGNOSIS — M542 Cervicalgia: Secondary | ICD-10-CM | POA: Diagnosis not present

## 2018-02-25 DIAGNOSIS — M6281 Muscle weakness (generalized): Secondary | ICD-10-CM | POA: Diagnosis not present

## 2018-02-27 DIAGNOSIS — M6281 Muscle weakness (generalized): Secondary | ICD-10-CM | POA: Diagnosis not present

## 2018-03-05 DIAGNOSIS — M542 Cervicalgia: Secondary | ICD-10-CM | POA: Diagnosis not present

## 2018-03-05 DIAGNOSIS — M6281 Muscle weakness (generalized): Secondary | ICD-10-CM | POA: Diagnosis not present

## 2018-03-05 DIAGNOSIS — I890 Lymphedema, not elsewhere classified: Secondary | ICD-10-CM | POA: Diagnosis not present

## 2018-03-06 ENCOUNTER — Ambulatory Visit: Payer: BLUE CROSS/BLUE SHIELD | Admitting: Family Medicine

## 2018-03-06 ENCOUNTER — Encounter: Payer: Self-pay | Admitting: Family Medicine

## 2018-03-06 VITALS — BP 104/62 | HR 68 | Ht 69.5 in | Wt 300.0 lb

## 2018-03-06 DIAGNOSIS — M5030 Other cervical disc degeneration, unspecified cervical region: Secondary | ICD-10-CM

## 2018-03-06 DIAGNOSIS — H9313 Tinnitus, bilateral: Secondary | ICD-10-CM | POA: Diagnosis not present

## 2018-03-06 DIAGNOSIS — L309 Dermatitis, unspecified: Secondary | ICD-10-CM

## 2018-03-06 MED ORDER — MELOXICAM 15 MG PO TABS: 15.0000 mg | ORAL_TABLET | Freq: Every day | ORAL | 0 refills | Status: DC

## 2018-03-06 MED ORDER — CYCLOBENZAPRINE HCL 10 MG PO TABS: 10.0000 mg | ORAL_TABLET | Freq: Three times a day (TID) | ORAL | 0 refills | Status: DC | PRN

## 2018-03-06 MED ORDER — CLOBETASOL PROP EMOLLIENT BASE 0.05 % EX CREA: 1.0000 "application " | TOPICAL_CREAM | Freq: Two times a day (BID) | CUTANEOUS | 3 refills | Status: DC

## 2018-03-06 NOTE — Patient Instructions (Signed)
Tinnitus Tinnitus refers to hearing a sound when there is no actual source for that sound. This is often described as ringing in the ears. However, people with this condition may hear a variety of noises. A person may hear the sound in one ear or in both ears. The sounds of tinnitus can be soft, loud, or somewhere in between. Tinnitus can last for a few seconds or can be constant for days. It may go away without treatment and come back at various times. When tinnitus is constant or happens often, it can lead to other problems, such as trouble sleeping and trouble concentrating. Almost everyone experiences tinnitus at some point. Tinnitus that is long-lasting (chronic) or comes back often is a problem that may require medical attention. What are the causes? The cause of tinnitus is often not known. In some cases, it can result from other problems or conditions, including:  Exposure to loud noises from machinery, music, or other sources.  Hearing loss.  Ear or sinus infections.  Earwax buildup.  A foreign object in the ear.  Use of certain medicines.  Use of alcohol and caffeine.  High blood pressure.  Heart diseases.  Anemia.  Allergies.  Meniere disease.  Thyroid problems.  Tumors.  An enlarged part of a weakened blood vessel (aneurysm).  What are the signs or symptoms? The main symptom of tinnitus is hearing a sound when there is no source for that sound. It may sound like:  Buzzing.  Roaring.  Ringing.  Blowing air, similar to the sound heard when you listen to a seashell.  Hissing.  Whistling.  Sizzling.  Humming.  Running water.  A sustained musical note.  How is this diagnosed? Tinnitus is diagnosed based on your symptoms. Your health care provider will do a physical exam. A comprehensive hearing exam (audiologic exam) will be done if your tinnitus:  Affects only one ear (unilateral).  Causes hearing difficulties.  Lasts 6 months or  longer.  You may also need to see a health care provider who specializes in hearing disorders (audiologist). You may be asked to complete a questionnaire to determine the severity of your tinnitus. Tests may be done to help determine the cause and to rule out other conditions. These can include:  Imaging studies of your head and brain, such as: ? A CT scan. ? An MRI.  An imaging study of your blood vessels (angiogram).  How is this treated? Treating an underlying medical condition can sometimes make tinnitus go away. If your tinnitus continues, other treatments may include:  Medicines, such as certain antidepressants or sleeping aids.  Sound generators to mask the tinnitus. These include: ? Tabletop sound machines that play relaxing sounds to help you fall asleep. ? Wearable devices that fit in your ear and play sounds or music. ? A small device that uses headphones to deliver a signal embedded in music (acoustic neural stimulation). In time, this may change the pathways of your brain and make you less sensitive to tinnitus. This device is used for very severe cases when no other treatment is working.  Therapy and counseling to help you manage the stress of living with tinnitus.  Using hearing aids or cochlear implants, if your tinnitus is related to hearing loss.  Follow these instructions at home:  When possible, avoid being in loud places and being exposed to loud sounds.  Wear hearing protection, such as earplugs, when you are exposed to loud noises.  Do not take stimulants, such as nicotine,   alcohol, or caffeine.  Practice techniques for reducing stress, such as meditation, yoga, or deep breathing.  Use a white noise machine, a humidifier, or other devices to mask the sound of tinnitus.  Sleep with your head slightly raised. This may reduce the impact of tinnitus.  Try to get plenty of rest each night. Contact a health care provider if:  You have tinnitus in just one  ear.  Your tinnitus continues for 3 weeks or longer without stopping.  Home care measures are not helping.  You have tinnitus after a head injury.  You have tinnitus along with any of the following: ? Dizziness. ? Loss of balance. ? Nausea and vomiting. This information is not intended to replace advice given to you by your health care provider. Make sure you discuss any questions you have with your health care provider. Document Released: 04/03/2005 Document Revised: 12/05/2015 Document Reviewed: 09/03/2013 Elsevier Interactive Patient Education  2018 [employer]  

## 2018-03-06 NOTE — Progress Notes (Signed)
Date:  03/06/2018   Name:  Rachael Gonzales   DOB:  1965-10-27   MRN:  [id number]   Chief Complaint: Follow-up (on neck pain- still there, but slightly better. Is going to PT- has had 4 appts. Is doing the exercises at home that were provided to her by PT.); Tinnitus (ringing in both ears that gets worse after the PT massages behind the ears. Last for a couple of days); and Eczema (needs refill on cream/ clobetasol) Neck Pain   This is a new problem. The current episode started more than 1 month ago. The problem occurs daily. The problem has been waxing and waning. The pain is associated with nothing. The quality of the pain is described as aching. The pain is at a severity of 4/10. The pain is moderate. Nothing aggravates the symptoms. Associated symptoms include headaches. Pertinent negatives include no chest pain, fever, leg pain, numbness, pain with swallowing, paresis, photophobia, syncope, tingling, trouble swallowing, visual change, weakness or weight loss. The treatment provided mild relief.  Otalgia   There is pain in both ears. This is a new problem. The current episode started more than 1 year ago. The problem occurs constantly. The problem has been waxing and waning. There has been no fever. The pain is moderate. Associated symptoms include headaches, hearing loss and neck pain. Pertinent negatives include no abdominal pain, coughing, diarrhea, ear discharge, rash, rhinorrhea, sore throat or vomiting. She has tried NSAIDs (cyclobenzaprine) for the symptoms. The treatment provided moderate relief.  Rash  This is a chronic problem. The current episode started more than 1 year ago. The affected locations include the right lower leg and left lower leg. The rash is characterized by dryness and itchiness. Pertinent negatives include no congestion, cough, diarrhea, eye pain, fatigue, fever, rhinorrhea, shortness of breath, sore throat or vomiting.     Review of Systems  Constitutional:  Negative.  Negative for chills, fatigue, fever, unexpected weight change and weight loss.  HENT: Positive for ear pain and hearing loss. Negative for congestion, ear discharge, rhinorrhea, sinus pressure, sneezing, sore throat and trouble swallowing.   Eyes: Negative for photophobia, pain, discharge, redness and itching.  Respiratory: Negative for cough, shortness of breath, wheezing and stridor.   Cardiovascular: Negative for chest pain and syncope.  Gastrointestinal: Negative for abdominal pain, blood in stool, constipation, diarrhea, nausea and vomiting.  Endocrine: Negative for cold intolerance, heat intolerance, polydipsia, polyphagia and polyuria.  Genitourinary: Negative for dysuria, flank pain, frequency, hematuria, menstrual problem, pelvic pain, urgency, vaginal bleeding and vaginal discharge.  Musculoskeletal: Positive for neck pain. Negative for arthralgias, back pain and myalgias.  Skin: Negative for rash.  Allergic/Immunologic: Negative for environmental allergies and food allergies.  Neurological: Positive for headaches. Negative for dizziness, tingling, weakness, light-headedness and numbness.  Hematological: Negative for adenopathy. Does not bruise/bleed easily.  Psychiatric/Behavioral: Negative for dysphoric mood. The patient is not nervous/anxious.     Patient Active Problem List   Diagnosis Date Noted  . Varicose veins of both lower extremities with inflammation 12/28/2017  . Swelling of limb 11/20/2017  . BMI 40.0-44.9, adult ([place]) 07/19/2016  . Heartburn   . Reflux esophagitis   . Acute gastritis without hemorrhage   . Diverticulitis of colon (without mention of hemorrhage)(562.11)   . Benign neoplasm of descending colon   . Depression 12/30/2014  . Irritable bowel syndrome 08/14/2014  . Adult BMI 30+ 08/14/2014  . Acute situational disturbance 08/14/2014  . Lateral epicondylitis of left elbow 07/04/2013  .  Heel pain, bilateral 05/26/2013    Allergies    Allergen Reactions  . Iodinated Diagnostic Agents Hives    CT A/P w on 01/10/16. Itching and hives.    Past Surgical History:  Procedure Laterality Date  . BACK SURGERY  04/17/1990   L5-S1  . CESAREAN SECTION    . COLONOSCOPY WITH PROPOFOL N/A 03/03/2016   Procedure: COLONOSCOPY WITH PROPOFOL;  Surgeon: Lucilla Lame, MD;  Location: [place];  Service: Endoscopy;  Laterality: N/A;  . ESOPHAGOGASTRODUODENOSCOPY (EGD) WITH PROPOFOL N/A 03/03/2016   Procedure: ESOPHAGOGASTRODUODENOSCOPY (EGD) WITH PROPOFOL;  Surgeon: Lucilla Lame, MD;  Location: [place];  Service: Endoscopy;  Laterality: N/A;  . POLYPECTOMY  03/03/2016   Procedure: POLYPECTOMY;  Surgeon: Lucilla Lame, MD;  Location: [place];  Service: Endoscopy;;  gastric biopsy Descending colon polyp.     Social History   Tobacco Use  . Smoking status: Former Smoker    Last attempt to quit: 04/18/2011    Years since quitting: 6.8  . Smokeless tobacco: Never Used  Substance Use Topics  . Alcohol use: Yes    Alcohol/week: 3.0 standard drinks    Types: 3 Shots of liquor per week  . Drug use: No     Medication list has been reviewed and updated.  Current Meds  Medication Sig  . Clobetasol Prop Emollient Base (CLOBETASOL PROPIONATE E) 0.05 % emollient cream Apply 1 application topically 2 (two) times daily.  . cyclobenzaprine (FLEXERIL) 10 MG tablet Take 1 tablet (10 mg total) by mouth 3 (three) times daily as needed for muscle spasms.  Marland Kitchen dicyclomine (BENTYL) 10 MG capsule Take 1 capsule (10 mg total) by mouth 3 (three) times daily.  . fluticasone (FLONASE) 50 MCG/ACT nasal spray Place 2 sprays into both nostrils daily.  Marland Kitchen loratadine (CLARITIN) 10 MG tablet Take 1 tablet by mouth 1 day or 1 dose.  . meloxicam (MOBIC) 15 MG tablet Take 1 tablet (15 mg total) by mouth daily.  . Multiple Vitamins-Minerals (MULTIVITAMIN & MINERAL) LIQD Take 1 tablet by mouth 1 day or 1 dose. pill  . pantoprazole  (PROTONIX) 40 MG tablet TAKE 1 TABLET BY MOUTH ONCE DAILY    PHQ 2/9 Scores 10/31/2017 05/25/2015  PHQ - 2 Score 0 0  PHQ- 9 Score 1 -    Physical Exam  BP 104/62   Pulse 68   Ht 5' 9.5" (1.765 m)   Wt 300 lb (136.1 kg)   LMP 02/08/2018 (Approximate)   BMI 43.67 kg/m   Assessment and Plan: 1. DDD (degenerative disc disease), cervical Recheck. Improved. Will continue Meloxicam 15 mg and cyclobenzaprine 10 mg prn spasm. - meloxicam (MOBIC) 15 MG tablet; Take 1 tablet (15 mg total) by mouth daily.  Dispense: 30 tablet; Refill: 0 - cyclobenzaprine (FLEXERIL) 10 MG tablet; Take 1 tablet (10 mg total) by mouth 3 (three) times daily as needed for muscle spasms.  Dispense: 30 tablet; Refill: 0  2. Tinnitus of both ears Chronic Persistent. Referral to ENT for for eval of tinnitus and hearing loss - Ambulatory referral to ENT  3. Eczema, unspecified type Chronic recurrent. Cont clobetasol bid.  - Clobetasol Prop Emollient Base (CLOBETASOL PROPIONATE E) 0.05 % emollient cream; Apply 1 application topically 2 (two) times daily.  Dispense: 30 g; Refill: 3     Dr. Otilio Miu [facility]  03/06/2018

## 2018-03-07 DIAGNOSIS — M6281 Muscle weakness (generalized): Secondary | ICD-10-CM | POA: Diagnosis not present

## 2018-03-07 DIAGNOSIS — M542 Cervicalgia: Secondary | ICD-10-CM | POA: Diagnosis not present

## 2018-03-07 DIAGNOSIS — M5440 Lumbago with sciatica, unspecified side: Secondary | ICD-10-CM | POA: Diagnosis not present

## 2018-03-12 ENCOUNTER — Ambulatory Visit (INDEPENDENT_AMBULATORY_CARE_PROVIDER_SITE_OTHER): Payer: BLUE CROSS/BLUE SHIELD | Admitting: Vascular Surgery

## 2018-03-12 ENCOUNTER — Encounter (INDEPENDENT_AMBULATORY_CARE_PROVIDER_SITE_OTHER): Payer: Self-pay | Admitting: Vascular Surgery

## 2018-03-12 VITALS — BP 121/74 | HR 77 | Resp 17 | Ht 69.5 in | Wt 301.0 lb

## 2018-03-12 DIAGNOSIS — I8311 Varicose veins of right lower extremity with inflammation: Secondary | ICD-10-CM | POA: Diagnosis not present

## 2018-03-12 DIAGNOSIS — I8312 Varicose veins of left lower extremity with inflammation: Secondary | ICD-10-CM | POA: Diagnosis not present

## 2018-03-12 DIAGNOSIS — M6281 Muscle weakness (generalized): Secondary | ICD-10-CM | POA: Diagnosis not present

## 2018-03-12 DIAGNOSIS — M542 Cervicalgia: Secondary | ICD-10-CM | POA: Diagnosis not present

## 2018-03-12 NOTE — Progress Notes (Signed)
Rachael Gonzales is a 52 y.o.female who presents with painful varicose veins of the right leg  Past Medical History:  Diagnosis Date  . Allergy   . Depression   . Diverticulitis   . Headache    sinus  . Irritable bowel     Past Surgical History:  Procedure Laterality Date  . BACK SURGERY  04/17/1990   L5-S1  . CESAREAN SECTION    . COLONOSCOPY WITH PROPOFOL N/A 03/03/2016   Procedure: COLONOSCOPY WITH PROPOFOL;  Surgeon: Lucilla Lame, MD;  Location: [place];  Service: Endoscopy;  Laterality: N/A;  . ESOPHAGOGASTRODUODENOSCOPY (EGD) WITH PROPOFOL N/A 03/03/2016   Procedure: ESOPHAGOGASTRODUODENOSCOPY (EGD) WITH PROPOFOL;  Surgeon: Lucilla Lame, MD;  Location: [place];  Service: Endoscopy;  Laterality: N/A;  . POLYPECTOMY  03/03/2016   Procedure: POLYPECTOMY;  Surgeon: Lucilla Lame, MD;  Location: [place];  Service: Endoscopy;;  gastric biopsy Descending colon polyp.     Current Outpatient Medications  Medication Sig Dispense Refill  . Clobetasol Prop Emollient Base (CLOBETASOL PROPIONATE E) 0.05 % emollient cream Apply 1 application topically 2 (two) times daily. 30 g 3  . cyclobenzaprine (FLEXERIL) 10 MG tablet Take 1 tablet (10 mg total) by mouth 3 (three) times daily as needed for muscle spasms. 30 tablet 0  . dicyclomine (BENTYL) 10 MG capsule Take 1 capsule (10 mg total) by mouth 3 (three) times daily. 90 capsule 11  . fluticasone (FLONASE) 50 MCG/ACT nasal spray Place 2 sprays into both nostrils daily. 16 g 0  . loratadine (CLARITIN) 10 MG tablet Take 1 tablet by mouth 1 day or 1 dose.    . meloxicam (MOBIC) 15 MG tablet Take 1 tablet (15 mg total) by mouth daily. 30 tablet 0  . Multiple Vitamins-Minerals (MULTIVITAMIN & MINERAL) LIQD Take 1 tablet by mouth 1 day or 1 dose. pill    . pantoprazole (PROTONIX) 40 MG tablet TAKE 1 TABLET BY MOUTH ONCE DAILY 180 tablet 0  . ibuprofen (ADVIL,MOTRIN) 200 MG tablet Take by mouth.     No current  facility-administered medications for this visit.     Allergies  Allergen Reactions  . Iodinated Diagnostic Agents Hives    CT A/P w on 01/10/16. Itching and hives.    Indication: Patient presents with symptomatic varicose veins of the right lower extremity.  Procedure: Foam sclerotherapy was performed on the right lower extremity. Using ultrasound guidance, 5 mL of foam Sotradecol was used to inject the varicosities of the right lower extremity. Compression wraps were placed. The patient tolerated the procedure well.

## 2018-03-20 DIAGNOSIS — H9313 Tinnitus, bilateral: Secondary | ICD-10-CM | POA: Diagnosis not present

## 2018-03-20 DIAGNOSIS — J301 Allergic rhinitis due to pollen: Secondary | ICD-10-CM | POA: Diagnosis not present

## 2018-03-28 DIAGNOSIS — M6281 Muscle weakness (generalized): Secondary | ICD-10-CM | POA: Diagnosis not present

## 2018-03-28 DIAGNOSIS — M542 Cervicalgia: Secondary | ICD-10-CM | POA: Diagnosis not present

## 2018-03-29 ENCOUNTER — Ambulatory Visit (INDEPENDENT_AMBULATORY_CARE_PROVIDER_SITE_OTHER): Payer: BLUE CROSS/BLUE SHIELD | Admitting: Vascular Surgery

## 2018-04-02 DIAGNOSIS — M6281 Muscle weakness (generalized): Secondary | ICD-10-CM | POA: Diagnosis not present

## 2018-04-04 ENCOUNTER — Other Ambulatory Visit: Payer: Self-pay | Admitting: Family Medicine

## 2018-04-04 DIAGNOSIS — M6281 Muscle weakness (generalized): Secondary | ICD-10-CM | POA: Diagnosis not present

## 2018-04-04 DIAGNOSIS — M542 Cervicalgia: Secondary | ICD-10-CM | POA: Diagnosis not present

## 2018-04-04 DIAGNOSIS — M5030 Other cervical disc degeneration, unspecified cervical region: Secondary | ICD-10-CM

## 2018-04-05 ENCOUNTER — Ambulatory Visit (INDEPENDENT_AMBULATORY_CARE_PROVIDER_SITE_OTHER): Payer: BLUE CROSS/BLUE SHIELD | Admitting: Vascular Surgery

## 2018-04-05 ENCOUNTER — Encounter (INDEPENDENT_AMBULATORY_CARE_PROVIDER_SITE_OTHER): Payer: Self-pay | Admitting: Vascular Surgery

## 2018-04-05 VITALS — BP 116/78 | HR 83 | Resp 14 | Ht 69.5 in | Wt 300.4 lb

## 2018-04-05 DIAGNOSIS — I890 Lymphedema, not elsewhere classified: Secondary | ICD-10-CM | POA: Insufficient documentation

## 2018-04-05 DIAGNOSIS — I8312 Varicose veins of left lower extremity with inflammation: Secondary | ICD-10-CM | POA: Diagnosis not present

## 2018-04-05 DIAGNOSIS — Z87891 Personal history of nicotine dependence: Secondary | ICD-10-CM

## 2018-04-05 DIAGNOSIS — M7989 Other specified soft tissue disorders: Secondary | ICD-10-CM | POA: Diagnosis not present

## 2018-04-05 DIAGNOSIS — I8311 Varicose veins of right lower extremity with inflammation: Secondary | ICD-10-CM

## 2018-04-05 NOTE — Assessment & Plan Note (Signed)
Better with elevation and the lymphedema pump.  Continue conservative therapy.  She understands weight loss would also be beneficial to improving her swelling.

## 2018-04-05 NOTE — Assessment & Plan Note (Signed)
Continue the use of the lymphedema pump and elevating her legs.  Symptoms are better but far from resolved.  Plan 1 year follow-up or sooner if problems develop in the interim.

## 2018-04-05 NOTE — Assessment & Plan Note (Signed)
Symptoms were improved with foam sclerotherapy.  No further interventions appear to be necessary at this time, but we will continue to monitor.

## 2018-04-05 NOTE — Progress Notes (Signed)
MRN : [id number]  Rachael Gonzales is a 52 y.o. (1965/07/15) female who presents with chief complaint of  Chief Complaint  Patient presents with  . Follow-up  .  History of Present Illness: Patient returns today in follow up of lymphedema and venous disease.  She has undergone some foam sclerotherapy treatments with marked improvement in the painful varicosities in the legs.  The lymphedema pump has provided excellent symptomatic relief to her lower extremities as well.  Overall, her symptoms are far from resolved but they are improved and she is pleased with the progress so far.  Current Outpatient Medications  Medication Sig Dispense Refill  . Clobetasol Prop Emollient Base (CLOBETASOL PROPIONATE E) 0.05 % emollient cream Apply 1 application topically 2 (two) times daily. 30 g 3  . dicyclomine (BENTYL) 10 MG capsule Take 1 capsule (10 mg total) by mouth 3 (three) times daily. 90 capsule 11  . fluticasone (FLONASE) 50 MCG/ACT nasal spray Place 2 sprays into both nostrils daily. 16 g 0  . ibuprofen (ADVIL,MOTRIN) 200 MG tablet Take by mouth.    . loratadine (CLARITIN) 10 MG tablet Take 1 tablet by mouth 1 day or 1 dose.    . Multiple Vitamins-Minerals (MULTIVITAMIN & MINERAL) LIQD Take 1 tablet by mouth 1 day or 1 dose. pill    . pantoprazole (PROTONIX) 40 MG tablet TAKE 1 TABLET BY MOUTH ONCE DAILY 180 tablet 0   No current facility-administered medications for this visit.     Past Medical History:  Diagnosis Date  . Allergy   . Depression   . Diverticulitis   . Headache    sinus  . Irritable bowel     Past Surgical History:  Procedure Laterality Date  . BACK SURGERY  04/17/1990   L5-S1  . CESAREAN SECTION    . COLONOSCOPY WITH PROPOFOL N/A 03/03/2016   Procedure: COLONOSCOPY WITH PROPOFOL;  Surgeon: Lucilla Lame, MD;  Location: [place];  Service: Endoscopy;  Laterality: N/A;  . ESOPHAGOGASTRODUODENOSCOPY (EGD) WITH PROPOFOL N/A 03/03/2016   Procedure:  ESOPHAGOGASTRODUODENOSCOPY (EGD) WITH PROPOFOL;  Surgeon: Lucilla Lame, MD;  Location: [place];  Service: Endoscopy;  Laterality: N/A;  . POLYPECTOMY  03/03/2016   Procedure: POLYPECTOMY;  Surgeon: Lucilla Lame, MD;  Location: [place];  Service: Endoscopy;;  gastric biopsy Descending colon polyp.    Family History  Problem Relation Age of Onset  . COPD Mother   . Asthma Mother   . Cancer Father   . Sudden death Neg Hx   . Hypertension Neg Hx   . Heart attack Neg Hx   . Diabetes Neg Hx   . Hyperlipidemia Neg Hx   . Breast cancer Neg Hx      Social History Social History        Tobacco Use  . Smoking status: Former Smoker    Last attempt to quit: 04/18/2011    Years since quitting: 6.5  . Smokeless tobacco: Never Used  Substance Use Topics  . Alcohol use: Yes    Alcohol/week: 1.8 oz    Types: 3 Shots of liquor per week  . Drug use: No          Allergies  Allergen Reactions  . Iodinated Diagnostic Agents Hives    CT A/P w on 01/10/16. Itching and hives.      REVIEW OF SYSTEMS(Negative unless checked)  Constitutional: [] ?Weight loss[] ?Fever[] ?Chills Cardiac:[] ?Chest pain[] ?Chest pressure[] ?Palpitations [] ?Shortness of breath when laying flat [] ?Shortness of breath at rest [] ?Shortness  of breath with exertion. Vascular: [x] ?Pain in legs with walking[x] ?Pain in legsat rest[] ?Pain in legs when laying flat [] ?Claudication [] ?Pain in feet when walking [] ?Pain in feet at rest [] ?Pain in feet when laying flat [] ?History of DVT [] ?Phlebitis [x] ?Swelling in legs [] ?Varicose veins [] ?Non-healing ulcers Pulmonary: [] ?Uses home oxygen [] ?Productive cough[] ?Hemoptysis [] ?Wheeze [] ?COPD [] ?Asthma Neurologic: [] ?Dizziness [] ?Blackouts [] ?Seizures [] ?History of stroke [] ?History of TIA[] ?Aphasia [] ?Temporary blindness[] ?Dysphagia [] ?Weaknessor numbness in  arms [] ?Weakness or numbnessin legs Musculoskeletal: [x] ?Arthritis [] ?Joint swelling [] ?Joint pain [] ?Low back pain Hematologic:[] ?Easy bruising[] ?Easy bleeding [] ?Hypercoagulable state [] ?Anemic [] ?Hepatitis Gastrointestinal:[] ?Blood in stool[] ?Vomiting blood[] ?Gastroesophageal reflux/heartburn[] ?Abdominal pain Genitourinary: [] ?Chronic kidney disease [] ?Difficulturination [] ?Frequenturination [] ?Burning with urination[] ?Hematuria Skin: [] ?Rashes [] ?Ulcers [] ?Wounds Psychological: [] ?History of anxiety[] ?History of major depression.     Physical Examination  BP 116/78 (BP Location: Right Arm, Patient Position: Sitting)   Pulse 83   Resp 14   Ht 5' 9.5" (1.765 m)   Wt (!) 300 lb 6.4 oz (136.3 kg)   BMI 43.73 kg/m  Gen:  WD/WN, NAD Head: [place]/AT, No temporalis wasting. Ear/Nose/Throat: Hearing grossly intact, nares w/o erythema or drainage Eyes: Conjunctiva clear. Sclera non-icteric Neck: Supple.  Trachea midline Pulmonary:  Good air movement, no use of accessory muscles.  Cardiac: RRR, no JVD Vascular:  Vessel Right Left  Radial Palpable Palpable                          PT Palpable Palpable  DP Palpable Palpable   Musculoskeletal: M/S 5/5 throughout.  No deformity or atrophy. Mild BLE edema. Neurologic: Sensation grossly intact in extremities.  Symmetrical.  Speech is fluent.  Psychiatric: Judgment intact, Mood & affect appropriate for pt's clinical situation. Dermatologic: No rashes or ulcers noted.  No cellulitis or open wounds.       Labs No results found for this or any previous visit (from the past 2160 hour(s)).  Radiology No results found.  Assessment/Plan  Swelling of limb Better with elevation and the lymphedema pump.  Continue conservative therapy.  She understands weight loss would also be beneficial to improving her swelling.  Varicose veins of both lower extremities with inflammation Symptoms  were improved with foam sclerotherapy.  No further interventions appear to be necessary at this time, but we will continue to monitor.  Lymphedema Continue the use of the lymphedema pump and elevating her legs.  Symptoms are better but far from resolved.  Plan 1 year follow-up or sooner if problems develop in the interim.    Leotis Pain, MD  04/05/2018 1:23 PM    This note was created with Dragon medical transcription system.  Any errors from dictation are purely unintentional

## 2018-04-05 NOTE — Patient Instructions (Signed)

## 2018-04-08 DIAGNOSIS — M542 Cervicalgia: Secondary | ICD-10-CM | POA: Diagnosis not present

## 2018-04-08 DIAGNOSIS — M6281 Muscle weakness (generalized): Secondary | ICD-10-CM | POA: Diagnosis not present

## 2018-04-09 ENCOUNTER — Encounter

## 2018-04-09 ENCOUNTER — Encounter (INDEPENDENT_AMBULATORY_CARE_PROVIDER_SITE_OTHER): Payer: BLUE CROSS/BLUE SHIELD

## 2018-04-09 ENCOUNTER — Ambulatory Visit (INDEPENDENT_AMBULATORY_CARE_PROVIDER_SITE_OTHER): Payer: BLUE CROSS/BLUE SHIELD | Admitting: Vascular Surgery

## 2018-04-16 DIAGNOSIS — J301 Allergic rhinitis due to pollen: Secondary | ICD-10-CM | POA: Diagnosis not present

## 2018-04-18 DIAGNOSIS — K219 Gastro-esophageal reflux disease without esophagitis: Secondary | ICD-10-CM | POA: Diagnosis not present

## 2018-04-18 DIAGNOSIS — J302 Other seasonal allergic rhinitis: Secondary | ICD-10-CM | POA: Diagnosis not present

## 2018-04-18 DIAGNOSIS — Z0000 Encounter for general adult medical examination without abnormal findings: Secondary | ICD-10-CM | POA: Diagnosis not present

## 2018-04-18 DIAGNOSIS — N951 Menopausal and female climacteric states: Secondary | ICD-10-CM | POA: Diagnosis not present

## 2018-04-23 ENCOUNTER — Telehealth: Payer: Self-pay | Admitting: Gastroenterology

## 2018-04-23 NOTE — Telephone Encounter (Signed)
Left vm letting the pt know she is due for her repeat colonoscopy on 02/2021 based on her last colonoscopy results.

## 2018-04-23 NOTE — Telephone Encounter (Signed)
PATIENT CALLED STATING SHE HAS TURNED 52 & HAD A COLONOSCOPY & UPPER ENDOSCOPY BY DR [facility] IN 2017. SHE WOULD LIKE TO KNOW WHAT THE RECOMMENDATIONS WHERE  FROM DR Allen Norris TO HAVE HER NEXT ONE.

## 2018-05-01 DIAGNOSIS — J301 Allergic rhinitis due to pollen: Secondary | ICD-10-CM | POA: Diagnosis not present

## 2018-06-17 DIAGNOSIS — K589 Irritable bowel syndrome without diarrhea: Secondary | ICD-10-CM | POA: Diagnosis not present

## 2018-06-17 DIAGNOSIS — N951 Menopausal and female climacteric states: Secondary | ICD-10-CM | POA: Diagnosis not present

## 2018-06-17 DIAGNOSIS — K219 Gastro-esophageal reflux disease without esophagitis: Secondary | ICD-10-CM | POA: Diagnosis not present

## 2018-06-17 DIAGNOSIS — J301 Allergic rhinitis due to pollen: Secondary | ICD-10-CM | POA: Diagnosis not present

## 2018-06-17 DIAGNOSIS — J302 Other seasonal allergic rhinitis: Secondary | ICD-10-CM | POA: Diagnosis not present

## 2018-06-20 DIAGNOSIS — J301 Allergic rhinitis due to pollen: Secondary | ICD-10-CM | POA: Diagnosis not present

## 2018-06-24 DIAGNOSIS — J301 Allergic rhinitis due to pollen: Secondary | ICD-10-CM | POA: Diagnosis not present

## 2018-06-26 DIAGNOSIS — J301 Allergic rhinitis due to pollen: Secondary | ICD-10-CM | POA: Diagnosis not present

## 2018-06-27 DIAGNOSIS — J301 Allergic rhinitis due to pollen: Secondary | ICD-10-CM | POA: Diagnosis not present

## 2018-07-01 DIAGNOSIS — J301 Allergic rhinitis due to pollen: Secondary | ICD-10-CM | POA: Diagnosis not present

## 2018-07-04 DIAGNOSIS — J301 Allergic rhinitis due to pollen: Secondary | ICD-10-CM | POA: Diagnosis not present

## 2018-07-24 ENCOUNTER — Encounter (INDEPENDENT_AMBULATORY_CARE_PROVIDER_SITE_OTHER): Payer: BLUE CROSS/BLUE SHIELD

## 2018-08-29 DIAGNOSIS — F419 Anxiety disorder, unspecified: Secondary | ICD-10-CM | POA: Diagnosis not present

## 2018-10-10 ENCOUNTER — Other Ambulatory Visit: Payer: Self-pay | Admitting: Gastroenterology

## 2018-10-30 DIAGNOSIS — Z86018 Personal history of other benign neoplasm: Secondary | ICD-10-CM | POA: Diagnosis not present

## 2018-10-30 DIAGNOSIS — J301 Allergic rhinitis due to pollen: Secondary | ICD-10-CM | POA: Diagnosis not present

## 2018-10-30 DIAGNOSIS — M7981 Nontraumatic hematoma of soft tissue: Secondary | ICD-10-CM | POA: Diagnosis not present

## 2018-10-30 DIAGNOSIS — L578 Other skin changes due to chronic exposure to nonionizing radiation: Secondary | ICD-10-CM | POA: Diagnosis not present

## 2018-11-04 DIAGNOSIS — J301 Allergic rhinitis due to pollen: Secondary | ICD-10-CM | POA: Diagnosis not present

## 2018-11-07 DIAGNOSIS — J301 Allergic rhinitis due to pollen: Secondary | ICD-10-CM | POA: Diagnosis not present

## 2018-11-11 DIAGNOSIS — J301 Allergic rhinitis due to pollen: Secondary | ICD-10-CM | POA: Diagnosis not present

## 2018-11-14 DIAGNOSIS — J301 Allergic rhinitis due to pollen: Secondary | ICD-10-CM | POA: Diagnosis not present

## 2018-11-15 DIAGNOSIS — J301 Allergic rhinitis due to pollen: Secondary | ICD-10-CM | POA: Diagnosis not present

## 2018-11-18 DIAGNOSIS — J301 Allergic rhinitis due to pollen: Secondary | ICD-10-CM | POA: Diagnosis not present

## 2018-11-21 DIAGNOSIS — J301 Allergic rhinitis due to pollen: Secondary | ICD-10-CM | POA: Diagnosis not present

## 2018-11-25 ENCOUNTER — Other Ambulatory Visit: Payer: Self-pay | Admitting: Physician Assistant

## 2018-11-25 DIAGNOSIS — Z1231 Encounter for screening mammogram for malignant neoplasm of breast: Secondary | ICD-10-CM

## 2018-11-25 DIAGNOSIS — J301 Allergic rhinitis due to pollen: Secondary | ICD-10-CM | POA: Diagnosis not present

## 2018-11-26 ENCOUNTER — Ambulatory Visit
Admission: RE | Admit: 2018-11-26 | Discharge: 2018-11-26 | Disposition: A | Discharge status: Home / Self Care | Payer: BC Managed Care – PPO | Source: Ambulatory Visit | Attending: Physician Assistant | Admitting: Physician Assistant

## 2018-11-26 ENCOUNTER — Other Ambulatory Visit: Payer: Self-pay

## 2018-11-26 DIAGNOSIS — Z1231 Encounter for screening mammogram for malignant neoplasm of breast: Secondary | ICD-10-CM | POA: Insufficient documentation

## 2018-11-28 DIAGNOSIS — J301 Allergic rhinitis due to pollen: Secondary | ICD-10-CM | POA: Diagnosis not present

## 2018-12-16 DIAGNOSIS — J301 Allergic rhinitis due to pollen: Secondary | ICD-10-CM | POA: Diagnosis not present

## 2018-12-18 DIAGNOSIS — G43019 Migraine without aura, intractable, without status migrainosus: Secondary | ICD-10-CM | POA: Diagnosis not present

## 2018-12-18 DIAGNOSIS — F411 Generalized anxiety disorder: Secondary | ICD-10-CM | POA: Diagnosis not present

## 2018-12-19 DIAGNOSIS — J301 Allergic rhinitis due to pollen: Secondary | ICD-10-CM | POA: Diagnosis not present

## 2018-12-26 DIAGNOSIS — J301 Allergic rhinitis due to pollen: Secondary | ICD-10-CM | POA: Diagnosis not present

## 2018-12-30 DIAGNOSIS — J301 Allergic rhinitis due to pollen: Secondary | ICD-10-CM | POA: Diagnosis not present

## 2019-01-02 DIAGNOSIS — J301 Allergic rhinitis due to pollen: Secondary | ICD-10-CM | POA: Diagnosis not present

## 2019-01-06 DIAGNOSIS — J301 Allergic rhinitis due to pollen: Secondary | ICD-10-CM | POA: Diagnosis not present

## 2019-01-08 DIAGNOSIS — J301 Allergic rhinitis due to pollen: Secondary | ICD-10-CM | POA: Diagnosis not present

## 2019-01-09 DIAGNOSIS — J301 Allergic rhinitis due to pollen: Secondary | ICD-10-CM | POA: Diagnosis not present

## 2019-01-13 DIAGNOSIS — J301 Allergic rhinitis due to pollen: Secondary | ICD-10-CM | POA: Diagnosis not present

## 2019-01-16 DIAGNOSIS — J301 Allergic rhinitis due to pollen: Secondary | ICD-10-CM | POA: Diagnosis not present

## 2019-01-23 DIAGNOSIS — J301 Allergic rhinitis due to pollen: Secondary | ICD-10-CM | POA: Diagnosis not present

## 2019-01-30 DIAGNOSIS — J301 Allergic rhinitis due to pollen: Secondary | ICD-10-CM | POA: Diagnosis not present

## 2019-02-03 DIAGNOSIS — J301 Allergic rhinitis due to pollen: Secondary | ICD-10-CM | POA: Diagnosis not present

## 2019-02-06 DIAGNOSIS — J301 Allergic rhinitis due to pollen: Secondary | ICD-10-CM | POA: Diagnosis not present

## 2019-02-10 DIAGNOSIS — J301 Allergic rhinitis due to pollen: Secondary | ICD-10-CM | POA: Diagnosis not present

## 2019-02-13 DIAGNOSIS — J301 Allergic rhinitis due to pollen: Secondary | ICD-10-CM | POA: Diagnosis not present

## 2019-02-20 DIAGNOSIS — J301 Allergic rhinitis due to pollen: Secondary | ICD-10-CM | POA: Diagnosis not present

## 2019-02-23 ENCOUNTER — Other Ambulatory Visit: Payer: Self-pay | Admitting: Gastroenterology

## 2019-02-27 DIAGNOSIS — J301 Allergic rhinitis due to pollen: Secondary | ICD-10-CM | POA: Diagnosis not present

## 2019-03-03 ENCOUNTER — Other Ambulatory Visit: Payer: Self-pay

## 2019-03-03 ENCOUNTER — Telehealth: Payer: Self-pay | Admitting: Gastroenterology

## 2019-03-03 MED ORDER — PANTOPRAZOLE SODIUM 40 MG PO TBEC: 40.0000 mg | DELAYED_RELEASE_TABLET | Freq: Every day | ORAL | 0 refills | Status: DC

## 2019-03-03 NOTE — Telephone Encounter (Signed)
Pt is scheduled for f/u rx for 04/28/18 and needs a refill to make it to apt rx for ulcer pt did not know name

## 2019-03-03 NOTE — Telephone Encounter (Signed)
Rx for Pantoprazole has been sent to pt's pharmacy.  

## 2019-03-06 DIAGNOSIS — J301 Allergic rhinitis due to pollen: Secondary | ICD-10-CM | POA: Diagnosis not present

## 2019-03-20 DIAGNOSIS — J301 Allergic rhinitis due to pollen: Secondary | ICD-10-CM | POA: Diagnosis not present

## 2019-03-24 DIAGNOSIS — J301 Allergic rhinitis due to pollen: Secondary | ICD-10-CM | POA: Diagnosis not present

## 2019-03-25 DIAGNOSIS — J301 Allergic rhinitis due to pollen: Secondary | ICD-10-CM | POA: Diagnosis not present

## 2019-04-01 DIAGNOSIS — J301 Allergic rhinitis due to pollen: Secondary | ICD-10-CM | POA: Diagnosis not present

## 2019-04-08 ENCOUNTER — Ambulatory Visit (INDEPENDENT_AMBULATORY_CARE_PROVIDER_SITE_OTHER): Payer: BLUE CROSS/BLUE SHIELD | Admitting: Vascular Surgery

## 2019-04-09 DIAGNOSIS — J301 Allergic rhinitis due to pollen: Secondary | ICD-10-CM | POA: Diagnosis not present

## 2019-04-29 ENCOUNTER — Ambulatory Visit: Payer: BC Managed Care – PPO | Admitting: Gastroenterology

## 2019-04-29 ENCOUNTER — Other Ambulatory Visit: Payer: Self-pay

## 2019-04-29 ENCOUNTER — Encounter: Payer: Self-pay | Admitting: Gastroenterology

## 2019-04-29 VITALS — BP 134/81 | HR 72 | Temp 97.8°F | Ht 69.5 in | Wt 307.0 lb

## 2019-04-29 DIAGNOSIS — K219 Gastro-esophageal reflux disease without esophagitis: Secondary | ICD-10-CM | POA: Diagnosis not present

## 2019-04-29 MED ORDER — PANTOPRAZOLE SODIUM 40 MG PO TBEC: 40.0000 mg | DELAYED_RELEASE_TABLET | Freq: Every day | ORAL | 3 refills | Status: DC

## 2019-04-29 NOTE — Progress Notes (Signed)
Primary Care Physician: Marinda Elk, MD  Primary Gastroenterologist:  Dr. Lucilla Lame  Chief Complaint  Patient presents with  . Medication Refill    HPI: Rachael Gonzales is a 54 y.o. female here for follow-up of heartburn.  Patient reports that when she stops her medications she has heartburn returning.  Otherwise as long as she is taking her pantoprazole she is feeling well without any issues.  The patient has no report of any black stools or bloody stools.  She is not due for colonoscopy until 2022.  This is for adenomatous polyps at her last colonoscopy in 2017.  There is no report of any unexplained weight loss or dysphagia.  Current Outpatient Medications  Medication Sig Dispense Refill  . acetaminophen (TYLENOL) 325 MG tablet Take by mouth.    Marland Kitchen azelastine (ASTELIN) 0.1 % nasal spray SMARTSIG:1-2 Spray(s) Both Nares Twice Daily    . Clobetasol Prop Emollient Base (CLOBETASOL PROPIONATE E) 0.05 % emollient cream Apply 1 application topically 2 (two) times daily. 30 g 3  . dicyclomine (BENTYL) 10 MG capsule Take 1 capsule (10 mg total) by mouth 3 (three) times daily. 90 capsule 11  . EPINEPHrine 0.3 mg/0.3 mL IJ SOAJ injection INJECT INTRAMUSCULARLY AS DIRECTED    . fluticasone (FLONASE) 50 MCG/ACT nasal spray Place 2 sprays into both nostrils daily. 16 g 0  . ibuprofen (ADVIL,MOTRIN) 200 MG tablet Take by mouth.    . loratadine (CLARITIN) 10 MG tablet Take 1 tablet by mouth 1 day or 1 dose.    . Multiple Vitamins-Minerals (MULTIVITAMIN & MINERAL) LIQD Take 1 tablet by mouth 1 day or 1 dose. pill    . pantoprazole (PROTONIX) 40 MG tablet Take 1 tablet (40 mg total) by mouth daily. 90 tablet 3  . PARoxetine Mesylate 7.5 MG CAPS      No current facility-administered medications for this visit.    Allergies as of 04/29/2019 - Review Complete 04/29/2019  Allergen Reaction Noted  . Iodinated diagnostic agents Hives 01/10/2016  . Cat hair extract  04/18/2018  . Dog  epithelium allergy skin test Cough 04/18/2018    ROS:  General: Negative for anorexia, weight loss, fever, chills, fatigue, weakness. ENT: Negative for hoarseness, difficulty swallowing , nasal congestion. CV: Negative for chest pain, angina, palpitations, dyspnea on exertion, peripheral edema.  Respiratory: Negative for dyspnea at rest, dyspnea on exertion, cough, sputum, wheezing.  GI: See history of present illness. GU:  Negative for dysuria, hematuria, urinary incontinence, urinary frequency, nocturnal urination.  Endo: Negative for unusual weight change.    Physical Examination:   BP 134/81   Pulse 72   Temp 97.8 F (36.6 C) (Oral)   Ht 5' 9.5" (1.765 m)   Wt (!) 307 lb (139.3 kg)   BMI 44.69 kg/m   General: Well-nourished, well-developed in no acute distress.  Eyes: No icterus. Conjunctivae pink. Lungs: Clear to auscultation bilaterally. Non-labored. Heart: Regular rate and rhythm, no murmurs rubs or gallops.  Abdomen: Bowel sounds are normal, nontender, nondistended, no hepatosplenomegaly or masses, no abdominal bruits or hernia , no rebound or guarding.   Extremities: No lower extremity edema. No clubbing or deformities. Neuro: Alert and oriented x 3.  Grossly intact. Skin: Warm and dry, no jaundice.   Psych: Alert and cooperative, normal mood and affect.  Labs:    Imaging Studies: No results found.  Assessment and Plan:   Rachael Gonzales is a 54 y.o. y/o female who comes in today with  a history of GERD and is in need of a refill for her pantoprazole.  The patient has no worry symptoms and has been doing well.  The patient will be given a refill of her prescriptions and has been told to contact us when she is out.  The patient has been explained the plan and agrees with it.     Lucilla Lame, MD. [facility]    Note: This dictation was prepared with Dragon dictation along with smaller phrase technology. Any transcriptional errors that result from this process are  unintentional.

## 2019-06-18 ENCOUNTER — Other Ambulatory Visit: Payer: Self-pay | Admitting: Physician Assistant

## 2019-06-18 DIAGNOSIS — N63 Unspecified lump in unspecified breast: Secondary | ICD-10-CM

## 2019-06-23 ENCOUNTER — Other Ambulatory Visit: Payer: Self-pay | Admitting: Physician Assistant

## 2019-06-23 DIAGNOSIS — N63 Unspecified lump in unspecified breast: Secondary | ICD-10-CM

## 2019-06-25 ENCOUNTER — Ambulatory Visit: Payer: BC Managed Care – PPO | Attending: Internal Medicine

## 2019-06-25 DIAGNOSIS — Z23 Encounter for immunization: Secondary | ICD-10-CM

## 2019-06-25 NOTE — Progress Notes (Signed)
   Covid-19 Vaccination Clinic  Name:  Rachael Gonzales    MRN: [id number] DOB: 1966-04-04  06/25/2019  [name] was observed post Covid-19 immunization for 15 minutes without incident. She was provided with Vaccine Information Sheet and instruction to access the V-Safe system.   [name] was instructed to call 911 with any severe reactions post vaccine: Marland Kitchen Difficulty breathing  . Swelling of face and throat  . A fast heartbeat  . A bad rash all over body  . Dizziness and weakness   Immunizations Administered    Name Date Dose VIS Date Route   Pfizer COVID-19 Vaccine 06/25/2019  5:01 PM 0.3 mL 03/28/2019 Intramuscular   Manufacturer: [place]   Lot: [id number]   [place]: [id number]

## 2019-07-01 ENCOUNTER — Ambulatory Visit
Admission: RE | Admit: 2019-07-01 | Discharge: 2019-07-01 | Disposition: A | Discharge status: Home / Self Care | Payer: BC Managed Care – PPO | Source: Ambulatory Visit | Attending: Physician Assistant | Admitting: Physician Assistant

## 2019-07-01 DIAGNOSIS — N63 Unspecified lump in unspecified breast: Secondary | ICD-10-CM | POA: Diagnosis present

## 2019-07-16 ENCOUNTER — Ambulatory Visit: Payer: BC Managed Care – PPO | Attending: Internal Medicine

## 2019-07-16 ENCOUNTER — Other Ambulatory Visit: Payer: Self-pay

## 2019-07-16 DIAGNOSIS — Z23 Encounter for immunization: Secondary | ICD-10-CM

## 2019-07-16 NOTE — Progress Notes (Signed)
   Covid-19 Vaccination Clinic  Name:  Rachael Gonzales    MRN: [id number] DOB: Aug 07, 1965  07/16/2019  Rachael Gonzales was observed post Covid-19 immunization for 15 minutes without incident. She was provided with Vaccine Information Sheet and instruction to access the V-Safe system.   Rachael Gonzales was instructed to call 911 with any severe reactions post vaccine: Marland Kitchen Difficulty breathing  . Swelling of face and throat  . A fast heartbeat  . A bad rash all over body  . Dizziness and weakness   Immunizations Administered    Name Date Dose VIS Date Route   Pfizer COVID-19 Vaccine 07/16/2019  9:24 AM 0.3 mL 03/28/2019 Intramuscular   Manufacturer: [place]   Lot: [id number]   [place]: [id number]

## 2019-12-14 IMAGING — MG MM DIGITAL DIAGNOSTIC UNILAT*R* W/ TOMO W/ CAD
6 series · 6 of 18 positions shown · non-contrast
Comparison: Previous exam(s).

CLINICAL DATA: Palpable lump right breast

EXAM:
DIGITAL DIAGNOSTIC RIGHT MAMMOGRAM WITH CAD AND TOMO
ULTRASOUND RIGHT BREAST

[R CC synth-2D (1 of 2)]
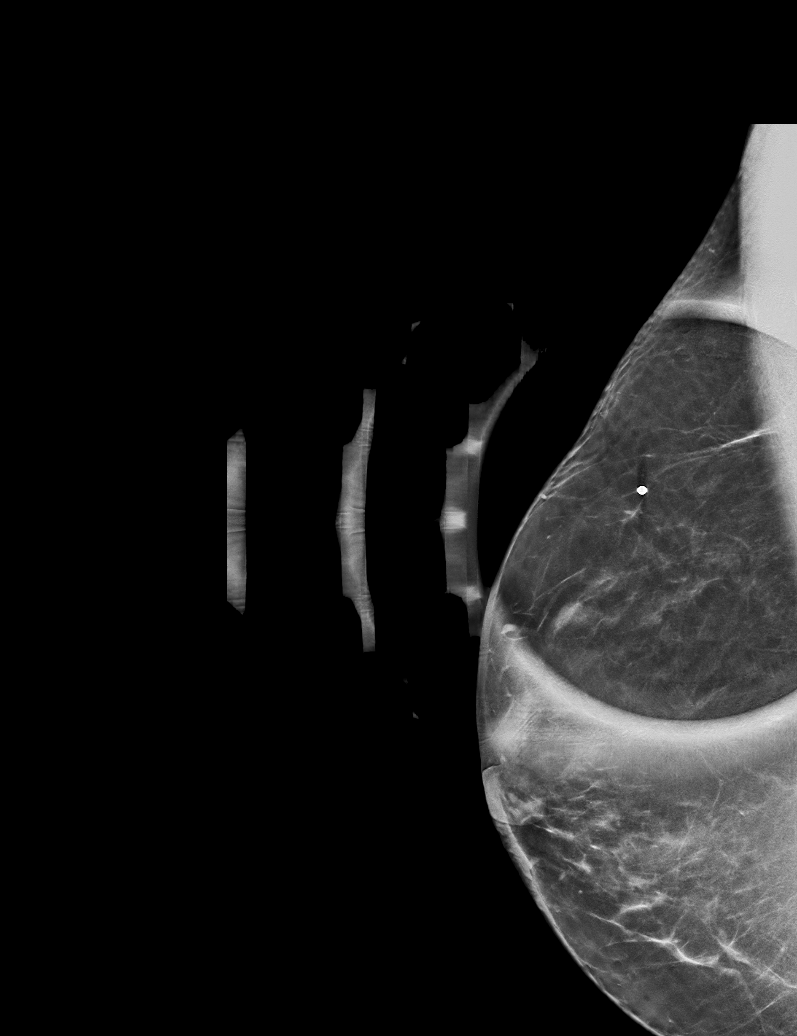

[R CC synth-2D (2 of 2)]
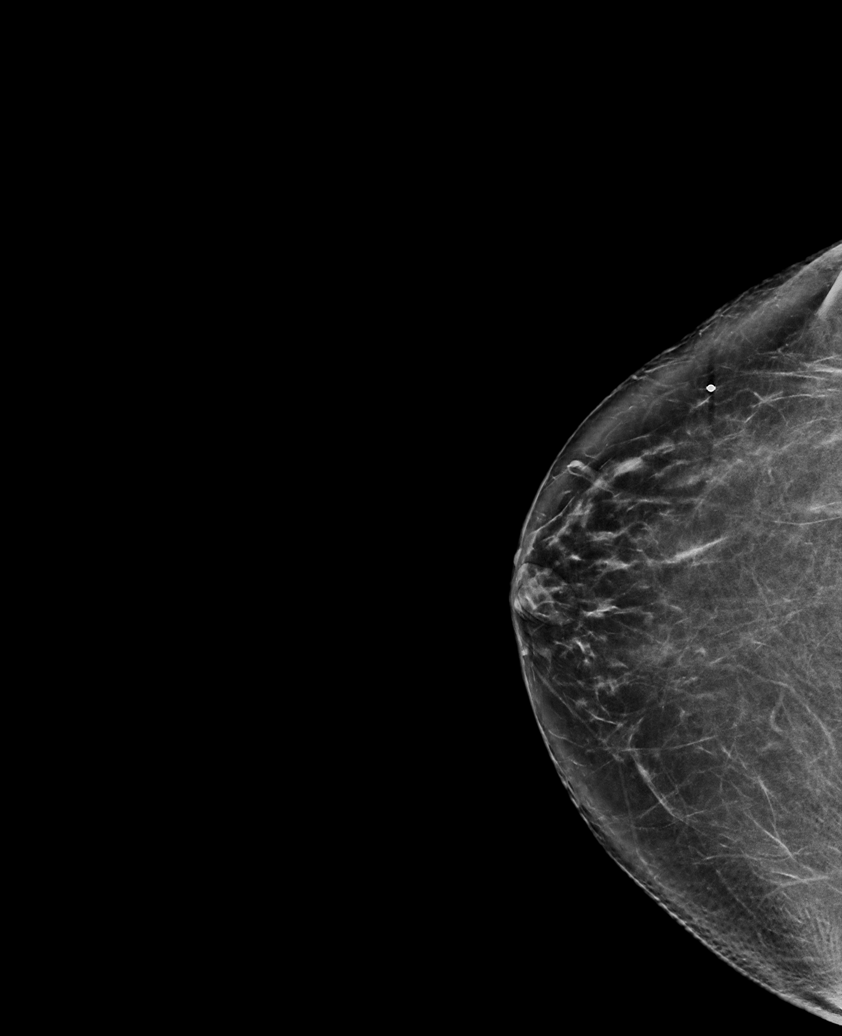

[R MLO synth-2D]
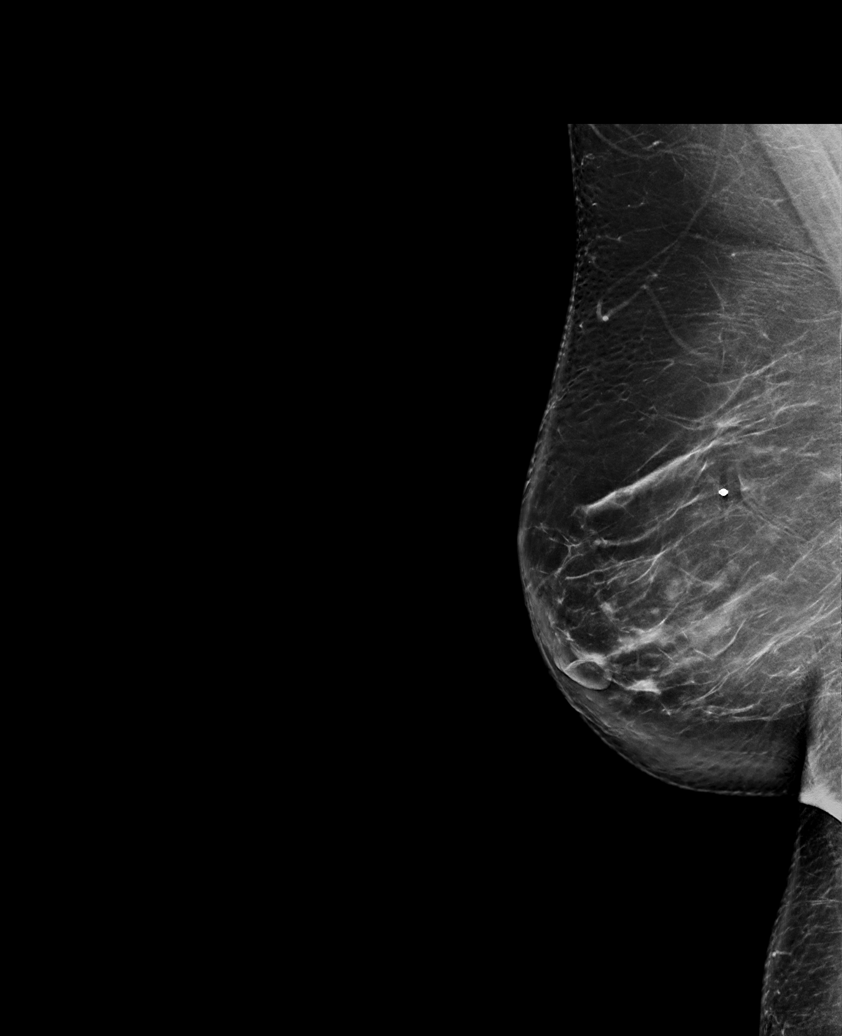

[R MLO tomo · tomo slice 53/106.0]
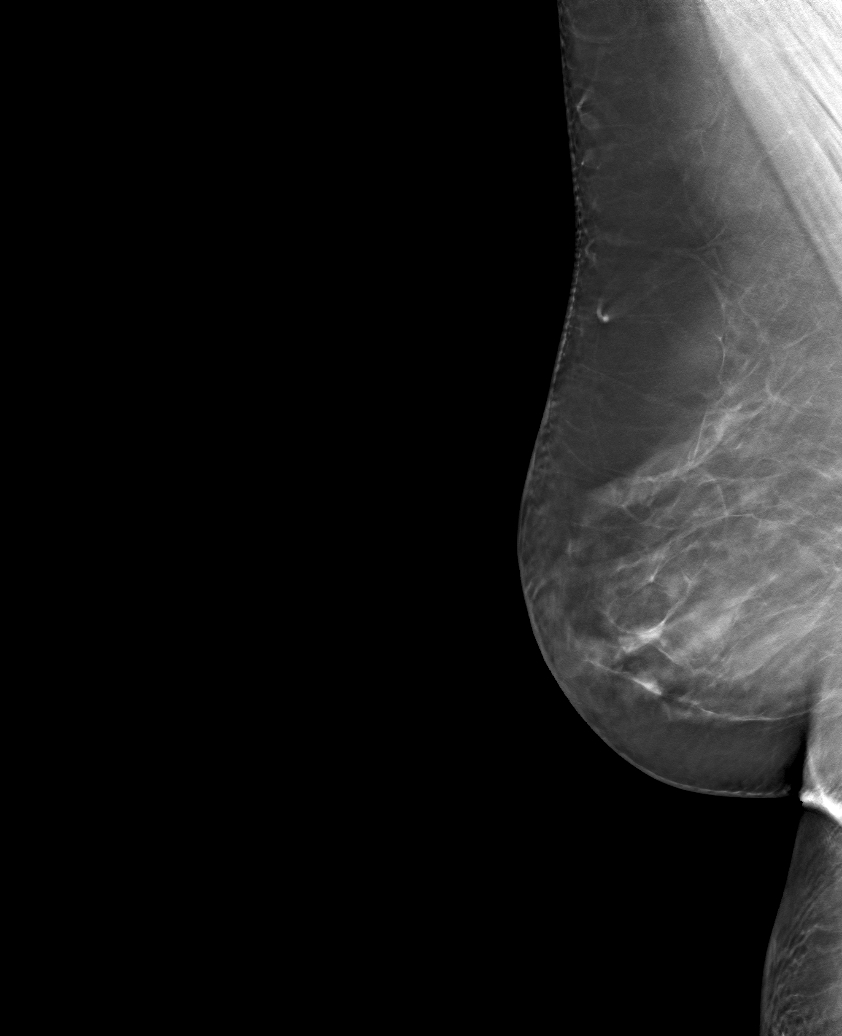

[R CC tomo (1 of 2) · tomo slice 39/78.0]
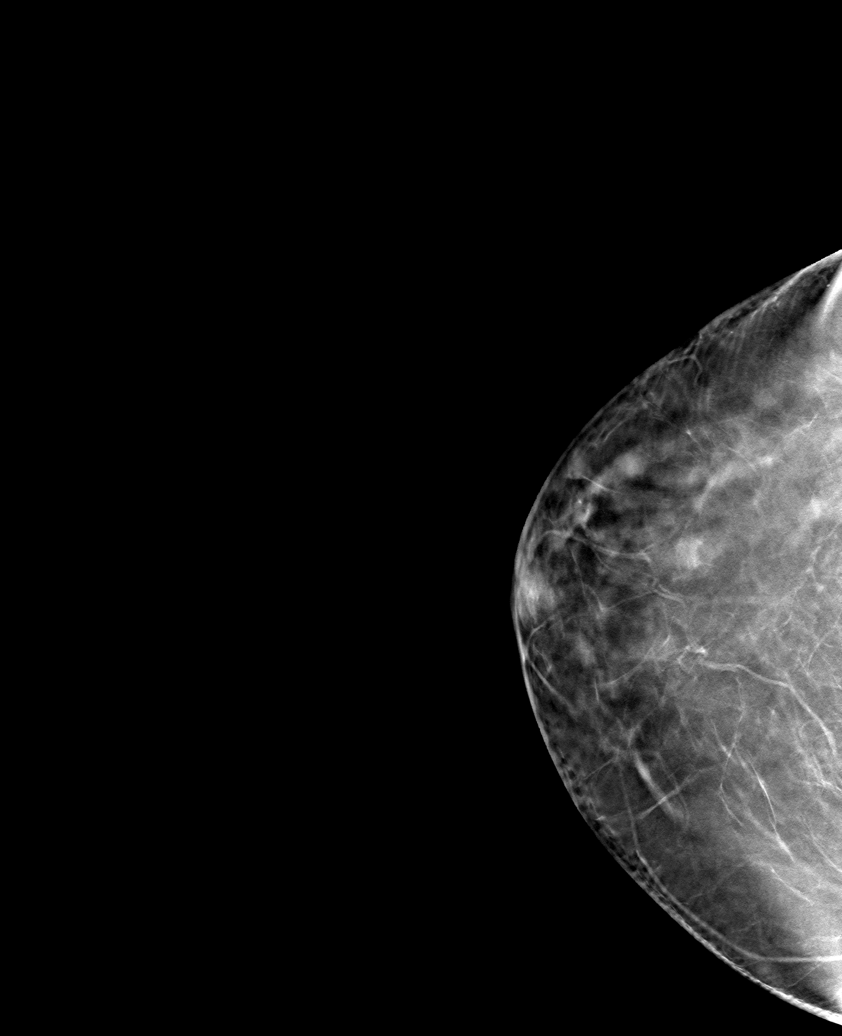

[R CC tomo (2 of 2) · tomo slice 27/54.0]
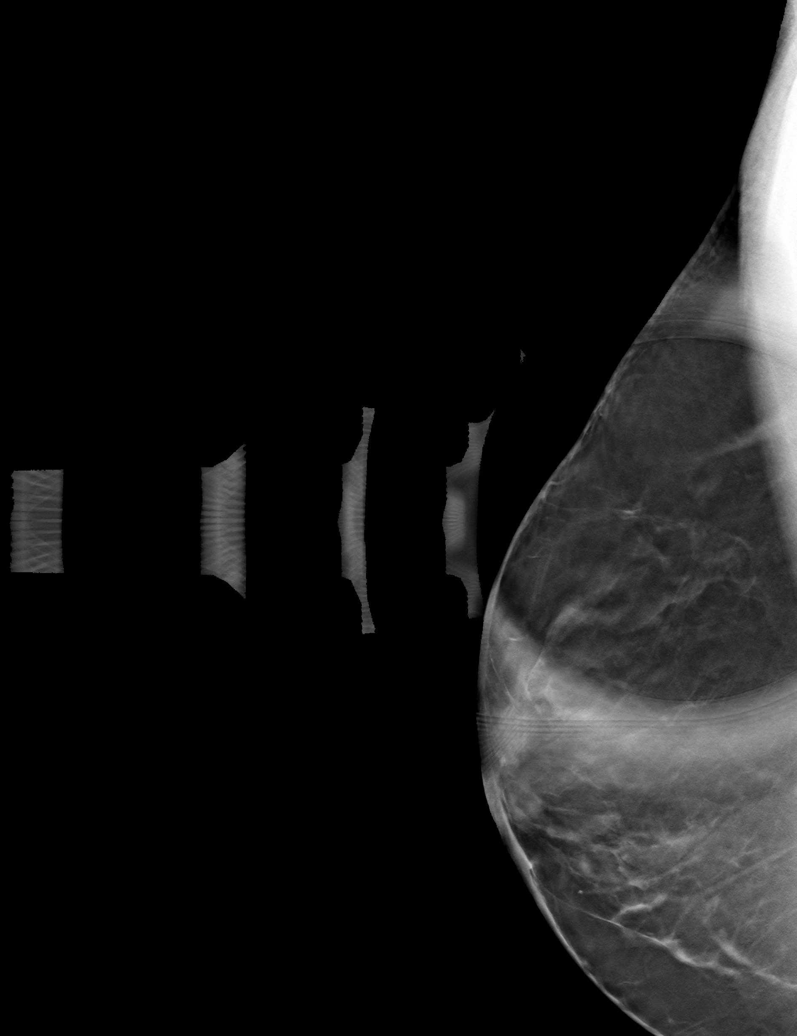

[6 of 18 positions shown; findings below may reference images not displayed]

ACR Breast Density Category b: There are scattered areas of
fibroglandular density.
FINDINGS: Cc and MLO views of the right breast are submitted. No suspicious
abnormalities identified in the right breast.

Mammographic images were processed with CAD.

Targeted ultrasound is performed, showing no focal abnormal discrete
cystic or solid lesion in the palpable area upper-outer quadrant
right breast.
IMPRESSION: Negative.

RECOMMENDATION:
Routine screening mammogram back on schedule.

I have discussed the findings and recommendations with the patient.
Results were also provided in writing at the conclusion of the
visit. If applicable, a reminder letter will be sent to the patient
regarding the next appointment.

BI-RADS CATEGORY  1: Negative.

## 2020-05-17 ENCOUNTER — Other Ambulatory Visit: Payer: Self-pay | Admitting: Physician Assistant

## 2020-05-17 DIAGNOSIS — Z1231 Encounter for screening mammogram for malignant neoplasm of breast: Secondary | ICD-10-CM

## 2020-05-21 ENCOUNTER — Other Ambulatory Visit: Payer: Self-pay

## 2020-05-21 MED ORDER — PANTOPRAZOLE SODIUM 40 MG PO TBEC: 40.0000 mg | DELAYED_RELEASE_TABLET | Freq: Every day | ORAL | 0 refills | Status: DC

## 2020-07-01 ENCOUNTER — Ambulatory Visit
Admission: RE | Admit: 2020-07-01 | Discharge: 2020-07-01 | Disposition: A | Discharge status: Home / Self Care | Payer: BC Managed Care – PPO | Source: Ambulatory Visit | Attending: Physician Assistant | Admitting: Physician Assistant

## 2020-07-01 ENCOUNTER — Other Ambulatory Visit: Payer: Self-pay

## 2020-07-01 DIAGNOSIS — Z1231 Encounter for screening mammogram for malignant neoplasm of breast: Secondary | ICD-10-CM | POA: Diagnosis not present

## 2020-07-27 ENCOUNTER — Other Ambulatory Visit: Payer: Self-pay

## 2020-07-27 MED ORDER — PANTOPRAZOLE SODIUM 40 MG PO TBEC: 40.0000 mg | DELAYED_RELEASE_TABLET | Freq: Every day | ORAL | 0 refills | Status: DC

## 2020-10-29 ENCOUNTER — Other Ambulatory Visit: Payer: Self-pay

## 2021-01-03 ENCOUNTER — Ambulatory Visit: Payer: BC Managed Care – PPO | Admitting: Family Medicine

## 2021-01-24 ENCOUNTER — Ambulatory Visit: Payer: BC Managed Care – PPO | Admitting: Family Medicine

## 2021-01-24 VITALS — Ht 69.5 in | Wt 299.0 lb

## 2021-01-24 DIAGNOSIS — G8929 Other chronic pain: Secondary | ICD-10-CM

## 2021-01-24 DIAGNOSIS — M25561 Pain in right knee: Secondary | ICD-10-CM | POA: Diagnosis not present

## 2021-01-24 DIAGNOSIS — M25562 Pain in left knee: Secondary | ICD-10-CM

## 2021-01-24 NOTE — Patient Instructions (Signed)
Keep up the good work with exercise and losing weight! 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 Avoid painful activities when possible (often deep squats, lunges, leg press bother this). Try not to get into a deep bend when you use the rowing machine. Do home exercises for the inside part of your quad and hip strengthening as directed. Add hamstring stretching and strengthening for the left side. Add ankle weight if these become too easy. Consider formal physical therapy. Avoid flat shoes, barefoot walking as much as possible. Icing 15 minutes at a time 3-4 times a day as needed. Tylenol or ibuprofen as needed for pain. Follow up with me in 6 weeks.  For the venous insufficiency calf raises will help with this but it should have no effect on your exercising otherwise.

## 2021-01-24 NOTE — Progress Notes (Signed)
PCP: Marinda Elk, MD  Subjective:   HPI: Patient is a 55 y.o. female here for bilateral knee pain.  Patient reports she's had bilateral knee pain anteriorly for about a year. Pain in right knee worse than the left. Notices more with stairs, after prolonged sitting. No swelling, bruising, acute injury. Has lost 28 pounds through diet and exercise. Also with tightness posterior left thigh with full extension of left knee. Has history of venous insufficiency and curious about exercise with this.  Past Medical History:  Diagnosis Date   Allergy    Depression    Diverticulitis    Headache    sinus   Irritable bowel     Current Outpatient Medications on File Prior to Visit  Medication Sig Dispense Refill   acetaminophen (TYLENOL) 325 MG tablet Take by mouth.     azelastine (ASTELIN) 0.1 % nasal spray SMARTSIG:1-2 Spray(s) Both Nares Twice Daily     Clobetasol Prop Emollient Base (CLOBETASOL PROPIONATE E) 0.05 % emollient cream Apply 1 application topically 2 (two) times daily. 30 g 3   dicyclomine (BENTYL) 10 MG capsule Take 1 capsule (10 mg total) by mouth 3 (three) times daily. 90 capsule 11   EPINEPHrine 0.3 mg/0.3 mL IJ SOAJ injection INJECT INTRAMUSCULARLY AS DIRECTED     fluticasone (FLONASE) 50 MCG/ACT nasal spray Place 2 sprays into both nostrils daily. 16 g 0   ibuprofen (ADVIL,MOTRIN) 200 MG tablet Take by mouth.     loratadine (CLARITIN) 10 MG tablet Take 1 tablet by mouth 1 day or 1 dose.     Multiple Vitamins-Minerals (MULTIVITAMIN & MINERAL) LIQD Take 1 tablet by mouth 1 day or 1 dose. pill     pantoprazole (PROTONIX) 40 MG tablet Take 1 tablet (40 mg total) by mouth daily. **PLEASE SCHEDULE YEARLY FOLLOW UP** 90 tablet 0   PARoxetine Mesylate 7.5 MG CAPS      No current facility-administered medications on file prior to visit.    Past Surgical History:  Procedure Laterality Date   BACK SURGERY  04/17/1990   L5-S1   CESAREAN SECTION     COLONOSCOPY WITH  PROPOFOL N/A 03/03/2016   Procedure: COLONOSCOPY WITH PROPOFOL;  Surgeon: Lucilla Lame, MD;  Location: [place];  Service: Endoscopy;  Laterality: N/A;   ESOPHAGOGASTRODUODENOSCOPY (EGD) WITH PROPOFOL N/A 03/03/2016   Procedure: ESOPHAGOGASTRODUODENOSCOPY (EGD) WITH PROPOFOL;  Surgeon: Lucilla Lame, MD;  Location: [place];  Service: Endoscopy;  Laterality: N/A;   POLYPECTOMY  03/03/2016   Procedure: POLYPECTOMY;  Surgeon: Lucilla Lame, MD;  Location: [address];  Service: Endoscopy;;  gastric biopsy Descending colon polyp.     Allergies  Allergen Reactions   Iodinated Diagnostic Agents Hives    CT A/P w on 01/10/16. Itching and hives.   Cat Hair Extract     Other reaction(s): Cough, Headache   Dog Epithelium Allergy Skin Test Cough    Other reaction(s): Headache    Ht 5' 9.5" (1.765 m)   Wt 299 lb (135.6 kg)   BMI 43.52 kg/m   [facility] Adult Exercise 01/24/2021  Frequency of aerobic exercise (# of days/week) 2  Average time in minutes 30  Frequency of strengthening activities (# of days/week) 0    No flowsheet data found.      Objective:  Physical Exam:  Gen: NAD, comfortable in exam room  Right knee: No gross deformity, ecchymoses, swelling.  VMO atrophy. No TTP. FROM with normal strength.  4/5 hip abduction. Negative ant/post  drawers. Negative valgus/varus testing. Negative lachman. Negative mcmurrays, apleys. NV intact distally.  Left knee: No gross deformity, ecchymoses, swelling. VMO atrophy No TTP. FROM with normal strength. 4/5 hip abduction Negative ant/post drawers. Negative valgus/varus testing. Negative lachman. Negative mcmurrays, apleys. NV intact distally.   Assessment & Plan:  1. Bilateral knee pain - 2/2 patellofemoral syndrome.  Shown home exercises to do daily.  Avoid deep squats, lunges.  Discussed other modifications and how to adjust her workouts.  Icing, tylenol or ibuprofen if needed.  F/u in 6  weeks.  2. Left hamstring tightness - added stretches and strengthening exercises for this.

## 2021-02-01 ENCOUNTER — Other Ambulatory Visit: Payer: Self-pay

## 2021-02-01 ENCOUNTER — Ambulatory Visit: Payer: BC Managed Care – PPO | Admitting: Gastroenterology

## 2021-02-01 ENCOUNTER — Encounter: Payer: Self-pay | Admitting: Gastroenterology

## 2021-02-01 VITALS — BP 143/84 | HR 87 | Temp 98.3°F | Ht 69.5 in | Wt 304.4 lb

## 2021-02-01 DIAGNOSIS — K219 Gastro-esophageal reflux disease without esophagitis: Secondary | ICD-10-CM | POA: Diagnosis not present

## 2021-02-01 DIAGNOSIS — Z86010 Personal history of colonic polyps: Secondary | ICD-10-CM

## 2021-02-01 MED ORDER — ONDANSETRON HCL 4 MG PO TABS: 4.0000 mg | ORAL_TABLET | Freq: Three times a day (TID) | ORAL | 0 refills | Status: DC | PRN

## 2021-02-01 MED ORDER — SUTAB 1479-225-188 MG PO TABS: 1.0000 | ORAL_TABLET | ORAL | 0 refills | Status: DC

## 2021-02-01 MED ORDER — PANTOPRAZOLE SODIUM 40 MG PO TBEC: 40.0000 mg | DELAYED_RELEASE_TABLET | Freq: Every day | ORAL | 3 refills | Status: DC

## 2021-02-01 NOTE — H&P (View-Only) (Signed)
Primary Care Physician: Marinda Elk, MD  Primary Gastroenterologist:  Dr. Lucilla Lame  Chief Complaint  Patient presents with   Medication Refill    HPI: Rachael Gonzales is a 55 y.o. female here for follow-up of GERD and need for a refill of her medication.  The patient reports that she has not had any dysphagia and nausea vomiting fevers or chills.  She has acid breakthrough but very intermittently and rarely.  The patient has cut out fast food and has watcher diet and reports that she has lost over 20 pounds.  There is no report of any abdominal pain.  The patient does report that she is due for a repeat colonoscopy since she had polyps at her last colonoscopy. She also requests that she is given medication for nausea since the prep has caused her to have nausea and vomiting previously.  Past Medical History:  Diagnosis Date   Allergy    Depression    Diverticulitis    Headache    sinus   Irritable bowel     Current Outpatient Medications  Medication Sig Dispense Refill   acetaminophen (TYLENOL) 325 MG tablet Take by mouth.     Clobetasol Prop Emollient Base (CLOBETASOL PROPIONATE E) 0.05 % emollient cream Apply 1 application topically 2 (two) times daily. 30 g 3   dicyclomine (BENTYL) 10 MG capsule Take 1 capsule (10 mg total) by mouth 3 (three) times daily. 90 capsule 11   EPINEPHrine 0.3 mg/0.3 mL IJ SOAJ injection INJECT INTRAMUSCULARLY AS DIRECTED     ibuprofen (ADVIL,MOTRIN) 200 MG tablet Take by mouth.     loratadine (CLARITIN) 10 MG tablet Take 1 tablet by mouth 1 day or 1 dose.     Multiple Vitamins-Minerals (MULTIVITAMIN & MINERAL) LIQD Take 1 tablet by mouth 1 day or 1 dose. pill     pantoprazole (PROTONIX) 40 MG tablet Take 1 tablet (40 mg total) by mouth daily. **PLEASE SCHEDULE YEARLY FOLLOW UP** 90 tablet 0   PARoxetine Mesylate 7.5 MG CAPS      No current facility-administered medications for this visit.    Allergies as of 02/01/2021 - Review  Complete 02/01/2021  Allergen Reaction Noted   Iodinated diagnostic agents Hives 01/10/2016   Cat hair extract  04/18/2018   Dog epithelium allergy skin test Cough 04/18/2018    ROS:  General: Negative for anorexia, weight loss, fever, chills, fatigue, weakness. ENT: Negative for hoarseness, difficulty swallowing , nasal congestion. CV: Negative for chest pain, angina, palpitations, dyspnea on exertion, peripheral edema.  Respiratory: Negative for dyspnea at rest, dyspnea on exertion, cough, sputum, wheezing.  GI: See history of present illness. GU:  Negative for dysuria, hematuria, urinary incontinence, urinary frequency, nocturnal urination.  Endo: Negative for unusual weight change.    Physical Examination:   BP (!) 143/84 (BP Location: Right Arm, Patient Position: Sitting, Cuff Size: Large)   Pulse 87   Temp 98.3 F (36.8 C) (Oral)   Ht 5' 9.5" (1.765 m)   Wt (!) 304 lb 6.4 oz (138.1 kg)   BMI 44.31 kg/m   General: Well-nourished, well-developed in no acute distress.  Eyes: No icterus. Conjunctivae pink. Lungs: Clear to auscultation bilaterally. Non-labored. Heart: Regular rate and rhythm, no murmurs rubs or gallops.  Abdomen: Bowel sounds are normal, nontender, nondistended, no hepatosplenomegaly or masses, no abdominal bruits or hernia , no rebound or guarding.   Extremities: No lower extremity edema. No clubbing or deformities. Neuro: Alert and oriented x 3.  Grossly intact. Skin: Warm and dry, no jaundice.   Psych: Alert and cooperative, normal mood and affect.  Labs:    Imaging Studies: No results found.  Assessment and Plan:   Rachael Gonzales is a 55 y.o. y/o female who comes in today with a history of needing a refill for her Protonix.  The patient is also due for colonoscopy.  The patient will be set up for colonoscopy and will be given Zofran tablets so that if she gets nauseated with the prep she can take the medication. The patient has been explained the  plan and agrees with it.     Lucilla Lame, MD. Marval Regal    Note: This dictation was prepared with Dragon dictation along with smaller phrase technology. Any transcriptional errors that result from this process are unintentional.

## 2021-02-01 NOTE — Progress Notes (Signed)
Primary Care Physician: Marinda Elk, MD  Primary Gastroenterologist:  Dr. Lucilla Lame  Chief Complaint  Patient presents with   Medication Refill    HPI: Rachael Gonzales is a 55 y.o. female here for follow-up of GERD and need for a refill of her medication.  The patient reports that she has not had any dysphagia and nausea vomiting fevers or chills.  She has acid breakthrough but very intermittently and rarely.  The patient has cut out fast food and has watcher diet and reports that she has lost over 20 pounds.  There is no report of any abdominal pain.  The patient does report that she is due for a repeat colonoscopy since she had polyps at her last colonoscopy. She also requests that she is given medication for nausea since the prep has caused her to have nausea and vomiting previously.  Past Medical History:  Diagnosis Date   Allergy    Depression    Diverticulitis    Headache    sinus   Irritable bowel     Current Outpatient Medications  Medication Sig Dispense Refill   acetaminophen (TYLENOL) 325 MG tablet Take by mouth.     Clobetasol Prop Emollient Base (CLOBETASOL PROPIONATE E) 0.05 % emollient cream Apply 1 application topically 2 (two) times daily. 30 g 3   dicyclomine (BENTYL) 10 MG capsule Take 1 capsule (10 mg total) by mouth 3 (three) times daily. 90 capsule 11   EPINEPHrine 0.3 mg/0.3 mL IJ SOAJ injection INJECT INTRAMUSCULARLY AS DIRECTED     ibuprofen (ADVIL,MOTRIN) 200 MG tablet Take by mouth.     loratadine (CLARITIN) 10 MG tablet Take 1 tablet by mouth 1 day or 1 dose.     Multiple Vitamins-Minerals (MULTIVITAMIN & MINERAL) LIQD Take 1 tablet by mouth 1 day or 1 dose. pill     pantoprazole (PROTONIX) 40 MG tablet Take 1 tablet (40 mg total) by mouth daily. **PLEASE SCHEDULE YEARLY FOLLOW UP** 90 tablet 0   PARoxetine Mesylate 7.5 MG CAPS      No current facility-administered medications for this visit.    Allergies as of 02/01/2021 - Review  Complete 02/01/2021  Allergen Reaction Noted   Iodinated diagnostic agents Hives 01/10/2016   Cat hair extract  04/18/2018   Dog epithelium allergy skin test Cough 04/18/2018    ROS:  General: Negative for anorexia, weight loss, fever, chills, fatigue, weakness. ENT: Negative for hoarseness, difficulty swallowing , nasal congestion. CV: Negative for chest pain, angina, palpitations, dyspnea on exertion, peripheral edema.  Respiratory: Negative for dyspnea at rest, dyspnea on exertion, cough, sputum, wheezing.  GI: See history of present illness. GU:  Negative for dysuria, hematuria, urinary incontinence, urinary frequency, nocturnal urination.  Endo: Negative for unusual weight change.    Physical Examination:   BP (!) 143/84 (BP Location: Right Arm, Patient Position: Sitting, Cuff Size: Large)   Pulse 87   Temp 98.3 F (36.8 C) (Oral)   Ht 5' 9.5" (1.765 m)   Wt (!) 304 lb 6.4 oz (138.1 kg)   BMI 44.31 kg/m   General: Well-nourished, well-developed in no acute distress.  Eyes: No icterus. Conjunctivae pink. Lungs: Clear to auscultation bilaterally. Non-labored. Heart: Regular rate and rhythm, no murmurs rubs or gallops.  Abdomen: Bowel sounds are normal, nontender, nondistended, no hepatosplenomegaly or masses, no abdominal bruits or hernia , no rebound or guarding.   Extremities: No lower extremity edema. No clubbing or deformities. Neuro: Alert and oriented x 3.  Grossly intact. Skin: Warm and dry, no jaundice.   Psych: Alert and cooperative, normal mood and affect.  Labs:    Imaging Studies: No results found.  Assessment and Plan:   Rachael Gonzales is a 55 y.o. y/o female who comes in today with a history of needing a refill for her Protonix.  The patient is also due for colonoscopy.  The patient will be set up for colonoscopy and will be given Zofran tablets so that if she gets nauseated with the prep she can take the medication. The patient has been explained the  plan and agrees with it.     Lucilla Lame, MD. Marval Regal    Note: This dictation was prepared with Dragon dictation along with smaller phrase technology. Any transcriptional errors that result from this process are unintentional.

## 2021-02-09 ENCOUNTER — Telehealth: Payer: Self-pay | Admitting: Gastroenterology

## 2021-02-09 NOTE — Telephone Encounter (Signed)
Pt.says she is having some burning and discomfort. She is requesting a call back to discuss what she should do

## 2021-02-10 NOTE — Telephone Encounter (Signed)
LVM for pt to return my call.

## 2021-02-21 NOTE — Telephone Encounter (Signed)
LVM again for pt to return my call.  

## 2021-02-28 ENCOUNTER — Encounter: Payer: Self-pay | Admitting: Gastroenterology

## 2021-03-01 ENCOUNTER — Ambulatory Visit: Payer: BC Managed Care – PPO | Admitting: Anesthesiology

## 2021-03-01 ENCOUNTER — Other Ambulatory Visit: Payer: Self-pay

## 2021-03-01 ENCOUNTER — Encounter
Admission: RE | Disposition: A | Discharge status: Home / Self Care | Payer: Self-pay | Source: Home / Self Care | Attending: Gastroenterology

## 2021-03-01 ENCOUNTER — Ambulatory Visit
Admission: RE | Admit: 2021-03-01 | Discharge: 2021-03-01 | Disposition: A | Discharge status: Home / Self Care | Payer: BC Managed Care – PPO | Attending: Gastroenterology | Admitting: Gastroenterology

## 2021-03-01 ENCOUNTER — Encounter: Payer: Self-pay | Admitting: Gastroenterology

## 2021-03-01 DIAGNOSIS — K648 Other hemorrhoids: Secondary | ICD-10-CM | POA: Insufficient documentation

## 2021-03-01 DIAGNOSIS — K5730 Diverticulosis of large intestine without perforation or abscess without bleeding: Secondary | ICD-10-CM | POA: Insufficient documentation

## 2021-03-01 DIAGNOSIS — Z1211 Encounter for screening for malignant neoplasm of colon: Secondary | ICD-10-CM | POA: Diagnosis present

## 2021-03-01 DIAGNOSIS — K635 Polyp of colon: Secondary | ICD-10-CM | POA: Insufficient documentation

## 2021-03-01 DIAGNOSIS — Z86010 Personal history of colonic polyps: Secondary | ICD-10-CM

## 2021-03-01 HISTORY — DX: Venous insufficiency (chronic) (peripheral): I87.2

## 2021-03-01 HISTORY — PX: COLONOSCOPY WITH PROPOFOL: SHX5780

## 2021-03-01 LAB — POCT PREGNANCY, URINE: Preg Test, Ur: NEGATIVE

## 2021-03-01 SURGERY — COLONOSCOPY WITH PROPOFOL
Anesthesia: General

## 2021-03-01 MED ORDER — PROPOFOL 10 MG/ML IV BOLUS
INTRAVENOUS | Status: DC | PRN
  Administered 2021-03-01 (×2): 20 mg via INTRAVENOUS
  Administered 2021-03-01: 80 mg via INTRAVENOUS

## 2021-03-01 MED ORDER — SODIUM CHLORIDE 0.9 % IV SOLN: INTRAVENOUS | Status: DC

## 2021-03-01 MED ORDER — PROPOFOL 500 MG/50ML IV EMUL
INTRAVENOUS | Status: DC | PRN
  Administered 2021-03-01: 140 ug/kg/min via INTRAVENOUS

## 2021-03-01 NOTE — Op Note (Signed)
[facility] Patient Name: Rachael Gonzales Procedure Date: 03/01/2021 10:33 AM MRN: [id number] Account #: [id number] Date of Birth: [birth year] Admit Type: Outpatient Age: 55 Room: [facility] ENDO ROOM 4 Gender: Female Note Status: Finalized Instrument Name: Jasper Riling [id number] Procedure:             Colonoscopy Indications:           High risk colon cancer surveillance: Personal history                         of colonic polyps Providers:             Lucilla Lame MD, MD Referring MD:          Precious Bard, MD (Referring MD) Medicines:             Propofol per Anesthesia Complications:         No immediate complications. Procedure:             Pre-Anesthesia Assessment:                        - Prior to the procedure, a History and Physical was                         performed, and patient medications and allergies were                         reviewed. The patient's tolerance of previous                         anesthesia was also reviewed. The risks and benefits                         of the procedure and the sedation options and risks                         were discussed with the patient. All questions were                         answered, and informed consent was obtained. Prior                         Anticoagulants: The patient has taken no previous                         anticoagulant or antiplatelet agents. ASA Grade                         Assessment: II - A patient with mild systemic disease.                         After reviewing the risks and benefits, the patient                         was deemed in satisfactory condition to undergo the                         procedure.  After obtaining informed consent, the colonoscope was                         passed under direct vision. Throughout the procedure,                         the patient's blood pressure, pulse, and oxygen                          saturations were monitored continuously. The                         Colonoscope was introduced through the anus and                         advanced to the the cecum, identified by appendiceal                         orifice and ileocecal valve. The colonoscopy was                         performed without difficulty. The patient tolerated                         the procedure well. The quality of the bowel                         preparation was excellent. Findings:      The perianal and digital rectal examinations were normal.      A 3 mm polyp was found in the ascending colon. The polyp was sessile.       The polyp was removed with a cold biopsy forceps. Resection and       retrieval were complete.      A few small-mouthed diverticula were found in the entire colon.      Non-bleeding internal hemorrhoids were found during retroflexion. The       hemorrhoids were Grade I (internal hemorrhoids that do not prolapse). Impression:            - One 3 mm polyp in the ascending colon, removed with                         a cold biopsy forceps. Resected and retrieved.                        - Diverticulosis in the entire examined colon.                        - Non-bleeding internal hemorrhoids. Recommendation:        - Discharge patient to home.                        - Resume previous diet.                        - Continue present medications.                        - Await pathology results.                        -  Repeat colonoscopy in 7 years for surveillance. Procedure Code(s):     --- Professional ---                        [phone], Colonoscopy, flexible; with biopsy, single or                         multiple Diagnosis Code(s):     --- Professional ---                        Z86.010, Personal history of colonic polyps                        K63.5, Polyp of colon CPT copyright 2019 [employer]. All rights reserved. The codes documented in this report are preliminary  and upon coder review may  be revised to meet current compliance requirements. Lucilla Lame MD, MD 03/01/2021 10:51:12 AM This report has been signed electronically. Number of Addenda: 0 Note Initiated On: 03/01/2021 10:33 AM Scope Withdrawal Time: 0 hours 8 minutes 22 seconds  Total Procedure Duration: 0 hours 10 minutes 59 seconds  Estimated Blood Loss:  Estimated blood loss: none.      [facility]

## 2021-03-01 NOTE — Anesthesia Preprocedure Evaluation (Signed)
Anesthesia Evaluation  Patient identified by MRN, date of birth, ID band Patient awake    Reviewed: Allergy & Precautions, NPO status , Patient's Chart, lab work & pertinent test results  Airway Mallampati: II  TM Distance: >3 FB Neck ROM: Full    Dental no notable dental hx.    Pulmonary neg pulmonary ROS, former smoker,    Pulmonary exam normal        Cardiovascular negative cardio ROS Normal cardiovascular exam     Neuro/Psych  Headaches, PSYCHIATRIC DISORDERS Anxiety Depression    GI/Hepatic Neg liver ROS, Bowel prep,GERD  Medicated and Controlled,  Endo/Other  Morbid obesity  Renal/GU negative Renal ROS  negative genitourinary   Musculoskeletal  (+) Arthritis ,   Abdominal   Peds negative pediatric ROS (+)  Hematology negative hematology ROS (+)   Anesthesia Other Findings Allergy    Depression    Diverticulitis    Headache  sinus  Irritable bowel       Reproductive/Obstetrics negative OB ROS                             Anesthesia Physical Anesthesia Plan  ASA: 2  Anesthesia Plan: General   Post-op Pain Management:    Induction: Intravenous  PONV Risk Score and Plan: 2 and Propofol infusion and TIVA  Airway Management Planned: Natural Airway and Nasal Cannula  Additional Equipment:   Intra-op Plan:   Post-operative Plan:   Informed Consent: I have reviewed the patients History and Physical, chart, labs and discussed the procedure including the risks, benefits and alternatives for the proposed anesthesia with the patient or authorized representative who has indicated his/her understanding and acceptance.       Plan Discussed with: CRNA, Anesthesiologist and Surgeon  Anesthesia Plan Comments:         Anesthesia Quick Evaluation

## 2021-03-01 NOTE — Interval H&P Note (Signed)
Lucilla Lame, MD [address] [place], [place] [address] Phone:[phone] Fax : [phone]  Primary Care Physician:  Marinda Elk, MD Primary Gastroenterologist:  Dr. Allen Norris  Pre-Procedure History & Physical: HPI:  Rachael Gonzales is a 55 y.o. female is here for an colonoscopy.   Past Medical History:  Diagnosis Date   Allergy    Depression    Diverticulitis    Headache    sinus   Irritable bowel    Venous insufficiency     Past Surgical History:  Procedure Laterality Date   BACK SURGERY  04/17/1990   L5-S1   CESAREAN SECTION     COLONOSCOPY WITH PROPOFOL N/A 03/03/2016   Procedure: COLONOSCOPY WITH PROPOFOL;  Surgeon: Lucilla Lame, MD;  Location: [facility];  Service: Endoscopy;  Laterality: N/A;   ESOPHAGOGASTRODUODENOSCOPY (EGD) WITH PROPOFOL N/A 03/03/2016   Procedure: ESOPHAGOGASTRODUODENOSCOPY (EGD) WITH PROPOFOL;  Surgeon: Lucilla Lame, MD;  Location: [facility];  Service: Endoscopy;  Laterality: N/A;   POLYPECTOMY  03/03/2016   Procedure: POLYPECTOMY;  Surgeon: Lucilla Lame, MD;  Location: [facility];  Service: Endoscopy;;  gastric biopsy Descending colon polyp.     Prior to Admission medications   Medication Sig Start Date End Date Taking? Authorizing Provider  acetaminophen (TYLENOL) 325 MG tablet Take by mouth.   Yes [provider]  ondansetron (ZOFRAN) 4 MG tablet Take 1 tablet (4 mg total) by mouth every 8 (eight) hours as needed for nausea or vomiting. 02/01/21  Yes Lucilla Lame, MD  pseudoephedrine (SUDAFED) 120 MG 12 hr tablet Take 120 mg by mouth 2 (two) times daily.   Yes [provider]  Clobetasol Prop Emollient Base (CLOBETASOL PROPIONATE E) 0.05 % emollient cream Apply 1 application topically 2 (two) times daily. 03/06/18   [name], MD  dicyclomine (BENTYL) 10 MG capsule Take 1 capsule (10 mg total) by mouth 3 (three) times daily. 07/04/17   [name], MD  EPINEPHrine  0.3 mg/0.3 mL IJ SOAJ injection INJECT INTRAMUSCULARLY AS DIRECTED 05/01/18   [provider]  ibuprofen (ADVIL,MOTRIN) 200 MG tablet Take by mouth.    [provider]  loratadine (CLARITIN) 10 MG tablet Take 1 tablet by mouth 1 day or 1 dose.    [provider]  Multiple Vitamins-Minerals (MULTIVITAMIN & MINERAL) LIQD Take 1 tablet by mouth 1 day or 1 dose. pill    [provider]  pantoprazole (PROTONIX) 40 MG tablet Take 1 tablet (40 mg total) by mouth daily. 02/01/21   Lucilla Lame, MD  PARoxetine Mesylate 7.5 MG CAPS  04/21/19   [provider]  Sodium Sulfate-Mag Sulfate-KCl (SUTAB) [phone] MG TABS Take 1 kit by mouth as directed. 02/01/21   Lucilla Lame, MD    Allergies as of 02/02/2021 - Review Complete 02/01/2021  Allergen Reaction Noted   Iodinated diagnostic agents Hives 01/10/2016   Cat hair extract  04/18/2018   Dog epithelium allergy skin test Cough 04/18/2018    Family History  Problem Relation Age of Onset   COPD Mother    Asthma Mother    Cancer Father        ? primary site (rare)   Cancer Maternal Grandmother        ? primary site   Sudden death Neg Hx    Hypertension Neg Hx    Heart attack Neg Hx    Diabetes Neg Hx    Hyperlipidemia Neg Hx    Breast cancer  Neg Hx     Social History   Socioeconomic History   Marital status: Married    Spouse name: Not on file   Number of children: Not on file   Years of education: Not on file   Highest education level: Not on file  Occupational History   Not on file  Tobacco Use   Smoking status: Former    Types: Cigarettes    Quit date: 04/18/2011    Years since quitting: 9.8   Smokeless tobacco: Never  Substance and Sexual Activity   Alcohol use: Yes    Alcohol/week: 3.0 standard drinks    Types: 3 Shots of liquor per week   Drug use: No   Sexual activity: Yes    Birth control/protection: None  Other Topics Concern   Not on file  Social History Narrative   Not  on file   Social Determinants of Health   Financial Resource Strain: Not on file  Food Insecurity: Not on file  Transportation Needs: Not on file  Physical Activity: Not on file  Stress: Not on file  Social Connections: Not on file  Intimate Partner Violence: Not on file    Review of Systems: See HPI, otherwise negative ROS  Physical Exam: BP (!) 112/95   Pulse 64   Temp (!) 97.2 F (36.2 C) (Temporal)   Resp 16   Ht 5' 9.5" (1.765 m)   Wt 134.3 kg   LMP 11/29/2020 (Approximate) Comment: Negative urine pregnancy 03-01-2021 ([facility])  SpO2 100%   BMI 43.08 kg/m  General:   Alert,  pleasant and cooperative in NAD Head:  Normocephalic and atraumatic. Neck:  Supple; no masses or thyromegaly. Lungs:  Clear throughout to auscultation.    Heart:  Regular rate and rhythm. Abdomen:  Soft, nontender and nondistended. Normal bowel sounds, without guarding, and without rebound.   Neurologic:  Alert and  oriented x4;  grossly normal neurologically.  Impression/Plan: Rachael Gonzales is here for an colonoscopy to be performed for a history of adenomatous polyps on 2017   Risks, benefits, limitations, and alternatives regarding  colonoscopy have been reviewed with the patient.  Questions have been answered.  All parties agreeable.   Lucilla Lame, MD  03/01/2021, 10:04 AM

## 2021-03-01 NOTE — Transfer of Care (Signed)
Immediate Anesthesia Transfer of Care Note  Patient: Rachael Gonzales  Procedure(s) Performed: COLONOSCOPY WITH PROPOFOL  Patient Location: PACU  Anesthesia Type:General  Level of Consciousness: awake, alert  and oriented  Airway & Oxygen Therapy: Patient Spontanous Breathing  Post-op Assessment: Report given to RN and Post -op Vital signs reviewed and stable  Post vital signs: Reviewed and stable  Last Vitals:  Vitals Value Taken Time  BP    Temp    Pulse 76 03/01/21 1057  Resp 17 03/01/21 1057  SpO2 100 % 03/01/21 1057  Vitals shown include unvalidated device data.  Last Pain:  Vitals:   03/01/21 1050  TempSrc: Temporal  PainSc:          Complications: No notable events documented.

## 2021-03-01 NOTE — Anesthesia Postprocedure Evaluation (Signed)
Anesthesia Post Note  Patient: Rachael Gonzales  Procedure(s) Performed: COLONOSCOPY WITH PROPOFOL  Patient location during evaluation: Phase II Anesthesia Type: General Level of consciousness: awake and alert, awake and oriented Pain management: pain level controlled Vital Signs Assessment: post-procedure vital signs reviewed and stable Respiratory status: spontaneous breathing, nonlabored ventilation and respiratory function stable Cardiovascular status: blood pressure returned to baseline and stable Postop Assessment: no apparent nausea or vomiting Anesthetic complications: no   No notable events documented.   Last Vitals:  Vitals:   03/01/21 1100 03/01/21 1110  BP: 126/83 136/77  Pulse: 72 70  Resp: 17 19  Temp:    SpO2: 99% 97%    Last Pain:  Vitals:   03/01/21 1050  TempSrc: Temporal  PainSc:                  Phill Mutter

## 2021-03-02 ENCOUNTER — Encounter: Payer: Self-pay | Admitting: Gastroenterology

## 2021-03-02 LAB — SURGICAL PATHOLOGY

## 2021-03-03 ENCOUNTER — Encounter: Payer: Self-pay | Admitting: Gastroenterology

## 2021-03-07 ENCOUNTER — Ambulatory Visit: Payer: BC Managed Care – PPO | Admitting: Family Medicine

## 2021-06-21 ENCOUNTER — Other Ambulatory Visit: Payer: Self-pay | Admitting: Physician Assistant

## 2021-06-21 DIAGNOSIS — Z1231 Encounter for screening mammogram for malignant neoplasm of breast: Secondary | ICD-10-CM

## 2021-07-07 ENCOUNTER — Ambulatory Visit: Payer: BC Managed Care – PPO

## 2021-07-11 ENCOUNTER — Ambulatory Visit
Admission: RE | Admit: 2021-07-11 | Discharge: 2021-07-11 | Disposition: A | Discharge status: Home / Self Care | Payer: BC Managed Care – PPO | Source: Ambulatory Visit | Attending: Physician Assistant | Admitting: Physician Assistant

## 2021-07-11 ENCOUNTER — Other Ambulatory Visit: Payer: Self-pay

## 2021-07-11 DIAGNOSIS — Z1231 Encounter for screening mammogram for malignant neoplasm of breast: Secondary | ICD-10-CM | POA: Insufficient documentation

## 2021-12-01 ENCOUNTER — Ambulatory Visit
Admission: RE | Admit: 2021-12-01 | Discharge: 2021-12-01 | Disposition: A | Discharge status: Home / Self Care | Payer: BC Managed Care – PPO | Source: Ambulatory Visit

## 2021-12-01 VITALS — BP 135/84 | HR 70 | Temp 98.1°F | Resp 18 | Ht 69.5 in | Wt 298.0 lb

## 2021-12-01 DIAGNOSIS — H6981 Other specified disorders of Eustachian tube, right ear: Secondary | ICD-10-CM | POA: Diagnosis not present

## 2021-12-01 DIAGNOSIS — H9201 Otalgia, right ear: Secondary | ICD-10-CM

## 2021-12-01 NOTE — Discharge Instructions (Addendum)
Take plain Mucinex (guaifenesin) 1200 mg every 12 hours as directed.  Take ibuprofen and use Flonase nasal spray as directed.    Follow up with your primary care provider or ENT if your symptoms are not improving.

## 2021-12-01 NOTE — ED Triage Notes (Signed)
Patient to urgent care with complaints of right ear fullness and pain. Reports symptoms started this morning. Also reports worsening tinnitus. Denies other symptoms other than allergies. Requires allergy shots.

## 2021-12-01 NOTE — ED Provider Notes (Signed)
Rachael Gonzales    CSN: [id number] Arrival date & time: 12/01/21  0901      History   Chief Complaint Chief Complaint  Patient presents with   Ear Fullness    Severe tinnitus and pain right ear - Entered by patient    HPI Rachael Gonzales is a 56 y.o. female.  Patient presents with right ear fullness and discomfort x1 day, worse since this morning.  No ear drainage, fever, chills, sore throat, cough, shortness of breath, vomiting, diarrhea, or other symptoms.  Treatment attempted with ibuprofen.  Her medical history includes tinnitus and seasonal allergies; she has an allergist and has seen an ENT last year for her tinnitus.  The history is provided by the patient, the spouse and medical records.    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/28/2017   Swelling of limb 11/20/2017   BMI 40.0-44.9, adult ([facility]) 07/19/2016   Heartburn    Reflux esophagitis    Acute gastritis without hemorrhage    Diverticulitis of colon (without mention of hemorrhage)(562.11)    Benign neoplasm of descending colon    Irritable bowel syndrome 08/14/2014   Adult BMI 30+ 08/14/2014   Acute situational disturbance 08/14/2014   Lateral epicondylitis of left elbow 07/04/2013    Past Surgical History:  Procedure Laterality Date   BACK SURGERY  04/17/1990   L5-S1   CESAREAN SECTION     COLONOSCOPY WITH PROPOFOL N/A 03/03/2016   Procedure: COLONOSCOPY WITH PROPOFOL;  Surgeon: Lucilla Lame, MD;  Location: [facility];  Service: Endoscopy;  Laterality: N/A;   COLONOSCOPY WITH PROPOFOL N/A  03/01/2021   Procedure: COLONOSCOPY WITH PROPOFOL;  Surgeon: Lucilla Lame, MD;  [facility];  Service: Endoscopy;  Laterality: N/A;   ESOPHAGOGASTRODUODENOSCOPY (EGD) WITH PROPOFOL N/A 03/03/2016   Procedure: ESOPHAGOGASTRODUODENOSCOPY (EGD) WITH PROPOFOL;  Surgeon: Lucilla Lame, MD;  Location: [facility];  Service: Endoscopy;  Laterality: N/A;   POLYPECTOMY  03/03/2016   Procedure: POLYPECTOMY;  Surgeon: Lucilla Lame, MD;  Location: [facility];  Service: Endoscopy;;  gastric biopsy Descending colon polyp.     OB History     Gravida  1   Para  1   Term  1   Preterm      AB      Living  1      SAB      IAB      Ectopic      Multiple      Live Births  1            Home Medications    Prior to Admission medications   Medication Sig Start Date End Date Taking? Authorizing Provider  acetaminophen (TYLENOL) 325 MG tablet Take by mouth.    [provider]  Clobetasol Prop Emollient Base (CLOBETASOL PROPIONATE E) 0.05 % emollient cream Apply 1 application topically 2 (two) times daily. 03/06/18   [name], MD  dicyclomine (BENTYL) 10 MG capsule Take 1 capsule (10 mg total) by mouth 3 (three) times daily. 07/04/17  [name], MD  EPINEPHrine 0.3 mg/0.3 mL IJ SOAJ injection INJECT INTRAMUSCULARLY AS DIRECTED 05/01/18   [provider]  ibuprofen (ADVIL,MOTRIN) 200 MG tablet Take by mouth.    [provider]  loratadine (CLARITIN) 10 MG tablet Take 1 tablet by mouth 1 day or 1 dose.    [provider]  Multiple Vitamins-Minerals (MULTIVITAMIN & MINERAL) LIQD Take 1 tablet by mouth 1 day or 1 dose. pill    [provider]  ondansetron (ZOFRAN) 4 MG tablet Take 1 tablet (4 mg total) by mouth every 8 (eight) hours as needed for nausea or vomiting. 02/01/21   Lucilla Lame, MD  pantoprazole (PROTONIX) 40 MG tablet Take 1 tablet (40 mg total) by mouth daily. 02/01/21   Lucilla Lame, MD   PARoxetine Mesylate 7.5 MG CAPS  04/21/19   [provider]  potassium chloride (KCL) 2 mEq/mL SOLN oral liquid Take 1 tablet by mouth daily.    [provider]  pseudoephedrine (SUDAFED) 120 MG 12 hr tablet Take 120 mg by mouth 2 (two) times daily.    [provider]  Sodium Sulfate-Mag Sulfate-KCl (SUTAB) [phone] MG TABS Take 1 kit by mouth as directed. 02/01/21   Lucilla Lame, MD  WEGOVY 0.25 MG/0.5ML SOAJ Inject 6 mLs into the skin. 10/11/21   [provider]    Family History Family History  Problem Relation Age of Onset   COPD Mother    Asthma Mother    Cancer Father        ? primary site (rare)   Cancer Maternal Grandmother        ? primary site   Sudden death Neg Hx    Hypertension Neg Hx    Heart attack Neg Hx    Diabetes Neg Hx    Hyperlipidemia Neg Hx    Breast cancer Neg Hx     Social History Social History   Tobacco Use   Smoking status: Former    Types: Cigarettes    Quit date: 04/18/2011    Years since quitting: 10.6   Smokeless tobacco: Never  Substance Use Topics   Alcohol use: Yes    Alcohol/week: 3.0 standard drinks of alcohol    Types: 3 Shots of liquor per week   Drug use: No     Allergies   Iodinated contrast media, Cat hair extract, and Dog epithelium allergy skin test   Review of Systems Review of Systems  Constitutional:  Negative for chills and fever.  HENT:  Positive for ear pain. Negative for ear discharge and sore throat.   Respiratory:  Negative for cough and shortness of breath.   Cardiovascular:  Negative for chest pain and palpitations.  Gastrointestinal:  Negative for diarrhea and vomiting.  Skin:  Negative for color change and rash.  All other systems reviewed and are negative.    Physical Exam Triage Vital Signs ED Triage Vitals  Enc Vitals Group     BP      Pulse      Resp      Temp      Temp src      SpO2      Weight      Height      Head Circumference      Peak Flow       Pain Score      Pain Loc      Pain Edu?      Excl. in [place]?    No  data found.  Updated Vital Signs BP 135/84   Pulse 70   Temp 98.1 F (36.7 C) (Temporal)   Resp 18   Ht 5' 9.5" (1.765 m)   Wt 298 lb (135.2 kg)   SpO2 99%   BMI 43.38 kg/m   Visual Acuity Right Eye Distance:   Left Eye Distance:   Bilateral Distance:    Right Eye Near:   Left Eye Near:    Bilateral Near:     Physical Exam Vitals and nursing note reviewed.  Constitutional:      General: She is not in acute distress.    Appearance: She is well-developed. She is not ill-appearing.  HENT:     Right Ear: Tympanic membrane and ear canal normal.     Left Ear: Tympanic membrane and ear canal normal.     Nose: Nose normal.     Mouth/Throat:     Mouth: Mucous membranes are moist.     Pharynx: Oropharynx is clear.  Cardiovascular:     Rate and Rhythm: Normal rate and regular rhythm.     Heart sounds: Normal heart sounds.  Pulmonary:     Effort: Pulmonary effort is normal. No respiratory distress.     Breath sounds: Normal breath sounds.  Musculoskeletal:     Cervical back: Neck supple.  Skin:    General: Skin is warm and dry.  Neurological:     Mental Status: She is alert.  Psychiatric:        Mood and Affect: Mood normal.        Behavior: Behavior normal.      UC Treatments / Results  Labs (all labs ordered are listed, but only abnormal results are displayed) Labs Reviewed - No data to display  EKG   Radiology No results found.  Procedures Procedures (including critical care time)  Medications Ordered in UC Medications - No data to display  Initial Impression / Assessment and Plan / UC Course  I have reviewed the triage vital signs and the nursing notes.  Pertinent labs & imaging results that were available during my care of the patient were reviewed by me and considered in my medical decision making (see chart for details).   Right eustachian tube dysfunction and otalgia.  Treating  with guaifenesin, ibuprofen, Flonase nasal spray.  Education provided on eustachian tube dysfunction.  Instructed patient to follow-up with her PCP or ENT if her symptoms are not improving.  She agrees to plan of care.   Final Clinical Impressions(s) / UC Diagnoses   Final diagnoses:  Dysfunction of right eustachian tube  Otalgia of right ear     Discharge Instructions      Take plain Mucinex (guaifenesin) 1200 mg every 12 hours as directed.  Take ibuprofen and use Flonase nasal spray as directed.    Follow up with your primary care provider or ENT if your symptoms are not improving.        ED Prescriptions   None    PDMP not reviewed this encounter.   Sharion Balloon, NP 12/01/21 [phone]

## 2022-02-13 ENCOUNTER — Encounter (INDEPENDENT_AMBULATORY_CARE_PROVIDER_SITE_OTHER): Payer: Self-pay

## 2022-03-08 ENCOUNTER — Other Ambulatory Visit: Payer: Self-pay | Admitting: Gastroenterology

## 2022-05-12 ENCOUNTER — Encounter (INDEPENDENT_AMBULATORY_CARE_PROVIDER_SITE_OTHER): Payer: Self-pay | Admitting: Vascular Surgery

## 2022-05-13 ENCOUNTER — Other Ambulatory Visit: Payer: Self-pay | Admitting: Gastroenterology

## 2022-05-16 ENCOUNTER — Other Ambulatory Visit: Payer: Self-pay | Admitting: Gastroenterology

## 2022-05-16 ENCOUNTER — Ambulatory Visit (INDEPENDENT_AMBULATORY_CARE_PROVIDER_SITE_OTHER): Payer: BC Managed Care – PPO | Admitting: Vascular Surgery

## 2022-05-16 ENCOUNTER — Encounter (INDEPENDENT_AMBULATORY_CARE_PROVIDER_SITE_OTHER): Payer: Self-pay | Admitting: Vascular Surgery

## 2022-05-16 VITALS — BP 119/73 | HR 67 | Ht 69.5 in | Wt 303.0 lb

## 2022-05-16 DIAGNOSIS — M7989 Other specified soft tissue disorders: Secondary | ICD-10-CM | POA: Diagnosis not present

## 2022-05-16 DIAGNOSIS — I890 Lymphedema, not elsewhere classified: Secondary | ICD-10-CM | POA: Diagnosis not present

## 2022-05-16 DIAGNOSIS — I8311 Varicose veins of right lower extremity with inflammation: Secondary | ICD-10-CM

## 2022-05-16 DIAGNOSIS — I8312 Varicose veins of left lower extremity with inflammation: Secondary | ICD-10-CM

## 2022-05-16 NOTE — Patient Instructions (Signed)
Lymphedema ? ?Lymphedema is swelling that is caused by the abnormal collection of lymph in the tissues under the skin. Lymph is excess fluid from the tissues in your body that is removed through the lymphatic system. This system is part of your body's defense system (immune system) and includes lymph nodes and lymph vessels. The lymph vessels collect and carry the excess fluid, fats, proteins, and waste from the tissues of the body to the bloodstream. This system also works to clean and remove bacteria and waste products from the body. ?Lymphedema occurs when the lymphatic system is blocked. When the lymph vessels or lymph nodes are blocked or damaged, lymph does not drain properly. This causes an abnormal buildup of lymph, which leads to swelling in the affected area. This may include the trunk area, or an arm or leg. Lymphedema cannot be cured by medicines, but various methods can be used to help reduce the swelling. ?What are the causes? ?The cause of this condition depends on the type of lymphedema that you have. ?Primary lymphedema is caused by the absence of lymph vessels or having abnormal lymph vessels at birth. ?Secondary lymphedema occurs when lymph vessels are blocked or damaged. Secondary lymphedema is more common. Common causes of lymph vessel blockage include: ?Skin infection, such as cellulitis. ?Infection by parasites (filariasis). ?Injury. ?Radiation therapy. ?Cancer. ?Formation of scar tissue. ?Surgery. ?What are the signs or symptoms? ?Symptoms of this condition include: ?Swelling of the arm or leg. ?A heavy or tight feeling in the arm or leg. ?Swelling of the feet, toes, or fingers. Shoes or rings may fit more tightly than before. ?Redness of the skin over the affected area. ?Limited movement of the affected limb. ?Sensitivity to touch or discomfort in the affected limb. ?How is this diagnosed? ?This condition may be diagnosed based on: ?Your symptoms and medical history. ?A physical  exam. ?Bioimpedance spectroscopy. In this test, painless electrical currents are used to measure fluid levels in your body. ?Imaging tests, such as: ?MRI. ?CT scan. ?Duplex ultrasound. This test uses sound waves to produce images of the vessels and the blood flow on a screen. ?Lymphoscintigraphy. In this test, a low dose of a radioactive substance is injected to trace the flow of lymph through your lymph vessels. ?Lymphangiography. In this test, a contrast dye is injected into the lymph vessel to help show blockages. ?How is this treated? ? ?If an underlying condition is causing the lymphedema, that condition will be treated. For example, antibiotic medicines may be used to treat an infection. ?Treatment for this condition will depend on the cause of your lymphedema. Treatment may include: ?Complete decongestive therapy (CDT). This is done by a certified lymphedema therapist to reduce fluid congestion. This therapy includes: ?Skin care. ?Compression wrapping of the affected area. ?Manual lymph drainage. This is a special massage technique that promotes lymph drainage out of a limb. ?Specific exercises. Certain exercises can help fluid move out of the affected limb. ?Compression. Various methods may be used to apply pressure to the affected limb to reduce the swelling. They include: ?Wearing compression stockings or sleeves on the affected limb. ?Wrapping the affected limb with special bandages. ?Surgery. This is usually done for severe cases only. For example, surgery may be done if you have trouble moving the limb or if the swelling does not get better with other treatments. ?Follow these instructions at home: ?Self-care ?The affected area is more likely to become injured or infected. Take these steps to help prevent infection: ?Keep the affected   area clean and dry. ?Use approved creams or lotions to keep the skin moisturized. ?Protect your skin from cuts: ?Use gloves while cooking or gardening. ?Do not walk  barefoot. ?If you shave the affected area, use an electric razor. ?Do not wear tight clothes, shoes, or jewelry. ?Eat a healthy diet that includes a lot of fruits and vegetables. ?Activity ?Do exercises as told by your health care provider. ?Do not sit with your legs crossed. ?When possible, keep the affected limb raised (elevated) above the level of your heart. ?Avoid carrying things with an arm that is affected by lymphedema. ?General instructions ?Wear compression stockings or sleeves as told by your health care provider. ?Note any changes in size of the affected limb. You may be instructed to take regular measurements and keep track of them. ?Take over-the-counter and prescription medicines only as told by your health care provider. ?If you were prescribed an antibiotic medicine, take or apply it as told by your health care provider. Do not stop using the antibiotic even if you start to feel better or if your condition improves. ?Do not use heating pads or ice packs on the affected area. ?Avoid having blood draws, IV insertions, or blood pressure checks on the affected limb. ?Keep all follow-up visits. This is important. ?Contact a health care provider if you: ?Continue to have swelling in your limb. ?Have fluid leaking from the skin of your swollen limb. ?Have a cut that does not heal. ?Have redness or pain in the affected area. ?Develop purplish spots, rash, blisters, or sores (lesions) on your affected limb. ?Get help right away if you: ?Have new swelling in your limb that starts suddenly. ?Have shortness of breath or chest pain. ?Have a fever or chills. ?These symptoms may represent a serious problem that is an emergency. Do not wait to see if the symptoms will go away. Get medical help right away. Call your local emergency services (911 in the [place] Do not drive yourself to the hospital. ?Summary ?Lymphedema is swelling that is caused by the abnormal collection of lymph in the tissues under the  skin. ?Lymph is fluid from the tissues in your body that is removed through the lymphatic system. This system collects and carries excess fluid, fats, proteins, and wastes from the tissues of the body to the bloodstream. ?Lymphedema causes swelling, pain, and redness in the affected area. This may include the trunk area, or an arm or leg. ?Treatment for this condition may depend on the cause of your lymphedema. Treatment may include treating the underlying cause, complete decongestive therapy (CDT), compression methods, or surgery. ?This information is not intended to replace advice given to you by your health care provider. Make sure you discuss any questions you have with your health care provider. ?Document Revised: 01/28/2020 Document Reviewed: 01/28/2020 ?Elsevier Patient Education ? 2023 [employer] ? ?

## 2022-05-16 NOTE — Assessment & Plan Note (Signed)
The patient has a fairly longstanding history of lymphedema which has been fairly well-controlled with compression socks, elevation, and the lymphedema pump until recently with the trauma.  Symptoms are now much worse.  Were going to get a reflux study to ensure that there venous issues such as recurrent reflux or DVT from her trauma.  She will continue the use of all her appropriate conservative therapies as current.  She also has some upcoming trips, I would like to get her venous evaluation before traveling on these trips.  She will return with her noninvasive studies in the near future at her convenience.

## 2022-05-16 NOTE — Assessment & Plan Note (Signed)
Previously treated for venous disease.  Plan a reflux study in the near future at her convenience.

## 2022-05-16 NOTE — Progress Notes (Signed)
Patient ID: Rachael Gonzales, female   DOB: [birth year], 57 y.o.   MRN: [id number]  Chief Complaint  Patient presents with   New Patient (Initial Visit)    np. consult. **see JD per referral** lymphedema + paresthesia. referred by mclaughlin, miriam     HPI Rachael Gonzales is a 57 y.o. female.  I am asked to see the patient by Paulita Cradle for evaluation of leg swelling with tingling and numbness with a history of venous disease and lymphedema.  I have seen her previously, but it has been over 4 years ago at this point.  She has been doing reasonably well using lymphedema pumps and elevating her legs with compression socks for the past several years.  A little over a month ago, she had a traumatic injury of her left foot and ankle with what sounds like an indolent fracture that was only recently noted.  She is scheduled to see a foot specialist for this fracture next week.  No open wounds or infection.  The leg has become swollen and painful.  The left leg is more severely affected of the 2 legs.     Past Medical History:  Diagnosis Date   Allergy    Depression    Diverticulitis    Headache    sinus   Irritable bowel    Venous insufficiency     Past Surgical History:  Procedure Laterality Date   BACK SURGERY  04/17/1990   L5-S1   CESAREAN SECTION     COLONOSCOPY WITH PROPOFOL N/A 03/03/2016   Procedure: COLONOSCOPY WITH PROPOFOL;  Surgeon: Lucilla Lame, MD;  Location: [place];  Service: Endoscopy;  Laterality: N/A;   COLONOSCOPY WITH PROPOFOL N/A 03/01/2021   Procedure: COLONOSCOPY WITH PROPOFOL;  Surgeon: Lucilla Lame, MD;  Location: [facility] ENDOSCOPY;  Service: Endoscopy;  Laterality: N/A;   ESOPHAGOGASTRODUODENOSCOPY (EGD) WITH PROPOFOL N/A 03/03/2016   Procedure: ESOPHAGOGASTRODUODENOSCOPY (EGD) WITH PROPOFOL;  Surgeon: Lucilla Lame, MD;  Location: [place];  Service: Endoscopy;  Laterality: N/A;   POLYPECTOMY  03/03/2016   Procedure: POLYPECTOMY;   Surgeon: Lucilla Lame, MD;  Location: [place];  Service: Endoscopy;;  gastric biopsy Descending colon polyp.      Family History  Problem Relation Age of Onset   COPD Mother    Asthma Mother    Cancer Father        ? primary site (rare)   Cancer Maternal Grandmother        ? primary site   Sudden death Neg Hx    Hypertension Neg Hx    Heart attack Neg Hx    Diabetes Neg Hx    Hyperlipidemia Neg Hx    Breast cancer Neg Hx      Social History   Tobacco Use   Smoking status: Former    Types: Cigarettes    Quit date: 04/18/2011    Years since quitting: 11.0   Smokeless tobacco: Never  Substance Use Topics   Alcohol use: Yes    Alcohol/week: 3.0 standard drinks of alcohol    Types: 3 Shots of liquor per week   Drug use: No     Allergies  Allergen Reactions   Iodinated Contrast Media Hives    CT A/P w on 01/10/16. Itching and hives.   Cat Hair Extract     Other reaction(s): Cough, Headache   Dog Epithelium Allergy Skin Test Cough    Other reaction(s): Headache    Current Outpatient Medications  Medication Sig Dispense Refill   acetaminophen (TYLENOL) 325 MG tablet Take by mouth.     Clobetasol Prop Emollient Base (CLOBETASOL PROPIONATE E) 0.05 % emollient cream Apply 1 application topically 2 (two) times daily. 30 g 3   dicyclomine (BENTYL) 10 MG capsule Take 1 capsule (10 mg total) by mouth 3 (three) times daily. 90 capsule 11   EPINEPHrine 0.3 mg/0.3 mL IJ SOAJ injection INJECT INTRAMUSCULARLY AS DIRECTED     ibuprofen (ADVIL,MOTRIN) 200 MG tablet Take by mouth.     loratadine (CLARITIN) 10 MG tablet Take 1 tablet by mouth 1 day or 1 dose.     Multiple Vitamins-Minerals (MULTIVITAMIN & MINERAL) LIQD Take 1 tablet by mouth 1 day or 1 dose. pill     pantoprazole (PROTONIX) 40 MG tablet Take 1 tablet (40 mg total) by mouth daily. SCHEDULE OFFICE VISIT 90 tablet 0   PARoxetine Mesylate 7.5 MG CAPS      potassium chloride (KCL) 2 mEq/mL SOLN oral liquid Take  1 tablet by mouth daily.     pseudoephedrine (SUDAFED) 120 MG 12 hr tablet Take 120 mg by mouth 2 (two) times daily.     Sodium Sulfate-Mag Sulfate-KCl (SUTAB) [phone] MG TABS Take 1 kit by mouth as directed. 24 tablet 0   No current facility-administered medications for this visit.     REVIEW OF SYSTEMS (Negative unless checked)   Constitutional: '[]'$ Weight loss  '[]'$ Fever  '[]'$ Chills Cardiac: '[]'$ Chest pain   '[]'$ Chest pressure   '[]'$ Palpitations   '[]'$ Shortness of breath when laying flat   '[]'$ Shortness of breath at rest   '[]'$ Shortness of breath with exertion. Vascular:  '[x]'$ Pain in legs with walking   '[x]'$ Pain in legs at rest   '[]'$ Pain in legs when laying flat   '[]'$ Claudication   '[]'$ Pain in feet when walking  '[]'$ Pain in feet at rest  '[]'$ Pain in feet when laying flat   '[]'$ History of DVT   '[]'$ Phlebitis   '[x]'$ Swelling in legs   '[x]'$ Varicose veins   '[]'$ Non-healing ulcers Pulmonary:   '[]'$ Uses home oxygen   '[]'$ Productive cough   '[]'$ Hemoptysis   '[]'$ Wheeze  '[]'$ COPD   '[]'$ Asthma Neurologic:  '[]'$ Dizziness  '[]'$ Blackouts   '[]'$ Seizures   '[]'$ History of stroke   '[]'$ History of TIA  '[]'$ Aphasia   '[]'$ Temporary blindness   '[]'$ Dysphagia   '[]'$ Weakness or numbness in arms   '[]'$ Weakness or numbness in legs Musculoskeletal:  '[x]'$ Arthritis   '[]'$ Joint swelling   '[x]'$ Joint pain   '[]'$ Low back pain Hematologic:  '[]'$ Easy bruising  '[]'$ Easy bleeding   '[]'$ Hypercoagulable state   '[]'$ Anemic  '[]'$ Hepatitis Gastrointestinal:  '[]'$ Blood in stool   '[]'$ Vomiting blood  '[]'$ Gastroesophageal reflux/heartburn   '[]'$ Abdominal pain Genitourinary:  '[]'$ Chronic kidney disease   '[]'$ Difficult urination  '[]'$ Frequent urination  '[]'$ Burning with urination   '[]'$ Hematuria Skin:  '[]'$ Rashes   '[]'$ Ulcers   '[]'$ Wounds Psychological:  '[]'$ History of anxiety   '[]'$  History of major depression.    Physical Exam BP 119/73   Pulse 67   Ht 5' 9.5" (1.765 m)   Wt (!) 303 lb (137.4 kg)   BMI 44.10 kg/m  Gen:  WD/WN, NAD. Obese  Head: [place]/AT, No temporalis wasting.  Ear/Nose/Throat: Hearing grossly intact, nares w/o  erythema or drainage, oropharynx w/o Erythema/Exudate Eyes: Conjunctiva clear, sclera non-icteric  Neck: trachea midline.  No JVD.  Pulmonary:  Good air movement, respirations not labored, no use of accessory muscles  Cardiac: RRR, no JVD Vascular:  Vessel Right Left  Radial Palpable Palpable  Gastrointestinal:. No masses, surgical incisions, or scars. Musculoskeletal: M/S 5/5 throughout.  Extremities without ischemic changes.  No deformity or atrophy.  Scattered varicosities present bilaterally.  Trace right lower extremity edema, 1-2+ left lower extremity edema. Neurologic: Sensation grossly intact in extremities.  Symmetrical.  Speech is fluent. Motor exam as listed above. Psychiatric: Judgment intact, Mood & affect appropriate for pt's clinical situation. Dermatologic: No rashes or ulcers noted.  No cellulitis or open wounds.    Radiology No results found.  Labs No results found for this or any previous visit (from the past 2160 hour(s)).  Assessment/Plan:  Lymphedema The patient has a fairly longstanding history of lymphedema which has been fairly well-controlled with compression socks, elevation, and the lymphedema pump until recently with the trauma.  Symptoms are now much worse.  Were going to get a reflux study to ensure that there venous issues such as recurrent reflux or DVT from her trauma.  She will continue the use of all her appropriate conservative therapies as current.  She also has some upcoming trips, I would like to get her venous evaluation before traveling on these trips.  She will return with her noninvasive studies in the near future at her convenience.  Swelling of limb The patient has a fairly longstanding history of lymphedema which has been fairly well-controlled with compression socks, elevation, and the lymphedema pump until recently with the trauma.  Symptoms are now much worse.  Were going to get a reflux study to  ensure that there venous issues such as recurrent reflux or DVT from her trauma.  She will continue the use of all her appropriate conservative therapies as current.  She also has some upcoming trips, I would like to get her venous evaluation before traveling on these trips.  She will return with her noninvasive studies in the near future at her convenience.  Varicose veins of both lower extremities with inflammation Previously treated for venous disease.  Plan a reflux study in the near future at her convenience.      Leotis Pain 05/16/2022, 12:44 PM   This note was created with Dragon medical transcription system.  Any errors from dictation are unintentional.

## 2022-05-18 ENCOUNTER — Telehealth: Payer: Self-pay | Admitting: Gastroenterology

## 2022-05-18 MED ORDER — PANTOPRAZOLE SODIUM 40 MG PO TBEC: 40.0000 mg | DELAYED_RELEASE_TABLET | Freq: Every day | ORAL | 0 refills | Status: DC

## 2022-05-18 NOTE — Telephone Encounter (Signed)
Patient calling requesting a refill on her pantoprazole. Scheduled an upcoming appointment with patient.

## 2022-05-18 NOTE — Telephone Encounter (Signed)
Rx sent through e-scribe  

## 2022-05-18 NOTE — Addendum Note (Signed)
Addended by: Lurlean Nanny on: 05/18/2022 08:30 AM   Modules accepted: Orders

## 2022-05-24 ENCOUNTER — Telehealth: Payer: Self-pay

## 2022-05-24 NOTE — Telephone Encounter (Signed)
Pt wanted advice regarding a new Rx ortho put her on due to her Hx of having an ulcer... Meloxicam 15 mg, pt would like to know if she can take this Rx as she just broke her foot  Please advise

## 2022-05-25 ENCOUNTER — Encounter: Payer: Self-pay | Admitting: Gastroenterology

## 2022-05-25 NOTE — Telephone Encounter (Signed)
Msg sent via Mychart per pt request

## 2022-05-26 NOTE — Telephone Encounter (Signed)
Last read by [name] at 11:13 AM on 05/25/2022

## 2022-05-29 ENCOUNTER — Other Ambulatory Visit: Payer: Self-pay | Admitting: Physician Assistant

## 2022-05-29 DIAGNOSIS — Z1231 Encounter for screening mammogram for malignant neoplasm of breast: Secondary | ICD-10-CM

## 2022-06-21 ENCOUNTER — Ambulatory Visit (INDEPENDENT_AMBULATORY_CARE_PROVIDER_SITE_OTHER): Payer: BC Managed Care – PPO

## 2022-06-21 ENCOUNTER — Encounter (INDEPENDENT_AMBULATORY_CARE_PROVIDER_SITE_OTHER): Payer: Self-pay | Admitting: Nurse Practitioner

## 2022-06-21 ENCOUNTER — Ambulatory Visit (INDEPENDENT_AMBULATORY_CARE_PROVIDER_SITE_OTHER): Payer: BC Managed Care – PPO | Admitting: Nurse Practitioner

## 2022-06-21 VITALS — BP 110/69 | HR 80 | Resp 16 | Wt 314.0 lb

## 2022-06-21 DIAGNOSIS — M7989 Other specified soft tissue disorders: Secondary | ICD-10-CM | POA: Diagnosis not present

## 2022-06-21 DIAGNOSIS — I890 Lymphedema, not elsewhere classified: Secondary | ICD-10-CM

## 2022-06-21 DIAGNOSIS — I8312 Varicose veins of left lower extremity with inflammation: Secondary | ICD-10-CM

## 2022-06-21 DIAGNOSIS — I8311 Varicose veins of right lower extremity with inflammation: Secondary | ICD-10-CM | POA: Diagnosis not present

## 2022-06-22 ENCOUNTER — Encounter (INDEPENDENT_AMBULATORY_CARE_PROVIDER_SITE_OTHER): Payer: Self-pay | Admitting: Nurse Practitioner

## 2022-06-22 NOTE — Progress Notes (Signed)
Subjective:    Patient ID: Rachael Gonzales, female    DOB: 1965/08/30, 57 y.o.   MRN: [id number] Chief Complaint  Patient presents with   Follow-up    Ultrasound follow up    Rachael Gonzales is a 57 y.o. female.  She returns today for evaluation of noninvasive studies regarding to lower extremity edema.  Patient has been having worsening leg swelling as well as some associated numbness and tingling.  She was also previously diagnosed with lymphedema and has lymphedema pump.  She notes that she uses it but not as consistently as she probably should.  She does wear medical grade compression stockings on a regular basis and has for several years.  She recently had a traumatic injury to the right the patient.  She was also found to have severe plantar fasciitis of both lower extremities.  She recently saw a foot specialist received cortisone shot she notes that the pain is drastically improved  Today noninvasive studies show no evidence of DVT or superficial thrombophlebitis bilaterally.  The patient has no evidence of deep venous insufficiency bilaterally.  No evidence of superficial venous reflux in the great saphenous vein bilaterally.  There is a small area of superficial reflux in the right small saphenous vein.     Review of Systems  Cardiovascular:  Positive for leg swelling.  Musculoskeletal:  Positive for arthralgias and gait problem.  All other systems reviewed and are negative.      Objective:   Physical Exam Vitals reviewed.  HENT:     Head: Normocephalic.  Cardiovascular:     Rate and Rhythm: Normal rate.  Pulmonary:     Effort: Pulmonary effort is normal.  Skin:    General: Skin is warm and dry.  Neurological:     Mental Status: She is alert and oriented to person, place, and time.  Psychiatric:        Mood and Affect: Mood normal.        Behavior: Behavior normal.        Thought Content: Thought content normal.        Judgment: Judgment normal.     BP 110/69  (BP Location: Left Arm)   Pulse 80   Resp 16   Wt (!) 314 lb (142.4 kg)   BMI 45.70 kg/m   Past Medical History:  Diagnosis Date   Allergy    Depression    Diverticulitis    Headache    sinus   Irritable bowel    Venous insufficiency     Social History   Socioeconomic History   Marital status: Married    Spouse name: Not on file   Number of children: Not on file   Years of education: Not on file   Highest education level: Not on file  Occupational History   Not on file  Tobacco Use   Smoking status: Former    Types: Cigarettes    Quit date: 04/18/2011    Years since quitting: 11.1   Smokeless tobacco: Never  Substance and Sexual Activity   Alcohol use: Yes    Alcohol/week: 3.0 standard drinks of alcohol    Types: 3 Shots of liquor per week   Drug use: No   Sexual activity: Yes    Birth control/protection: None  Other Topics Concern   Not on file  Social History Narrative   Not on file   Social Determinants of Health   Financial Resource Strain: Not on file  Food Insecurity:  Not on file  Transportation Needs: Not on file  Physical Activity: Not on file  Stress: Not on file  Social Connections: Not on file  Intimate Partner Violence: Not on file    Past Surgical History:  Procedure Laterality Date   BACK SURGERY  04/17/1990   L5-S1   CESAREAN SECTION     COLONOSCOPY WITH PROPOFOL N/A 03/03/2016   Procedure: COLONOSCOPY WITH PROPOFOL;  Surgeon: Lucilla Lame, MD;  Location: [place];  Service: Endoscopy;  Laterality: N/A;   COLONOSCOPY WITH PROPOFOL N/A 03/01/2021   Procedure: COLONOSCOPY WITH PROPOFOL;  Surgeon: Lucilla Lame, MD;  Location: [facility] ENDOSCOPY;  Service: Endoscopy;  Laterality: N/A;   ESOPHAGOGASTRODUODENOSCOPY (EGD) WITH PROPOFOL N/A 03/03/2016   Procedure: ESOPHAGOGASTRODUODENOSCOPY (EGD) WITH PROPOFOL;  Surgeon: Lucilla Lame, MD;  Location: [place];  Service: Endoscopy;  Laterality: N/A;   POLYPECTOMY  03/03/2016    Procedure: POLYPECTOMY;  Surgeon: Lucilla Lame, MD;  Location: [place];  Service: Endoscopy;;  gastric biopsy Descending colon polyp.     Family History  Problem Relation Age of Onset   COPD Mother    Asthma Mother    Cancer Father        ? primary site (rare)   Cancer Maternal Grandmother        ? primary site   Sudden death Neg Hx    Hypertension Neg Hx    Heart attack Neg Hx    Diabetes Neg Hx    Hyperlipidemia Neg Hx    Breast cancer Neg Hx     Allergies  Allergen Reactions   Iodinated Contrast Media Hives    CT A/P w on 01/10/16. Itching and hives.   Cat Hair Extract     Other reaction(s): Cough, Headache   Dog Epithelium Allergy Skin Test Cough    Other reaction(s): Headache       Latest Ref Rng & Units 07/19/2016   10:07 AM 01/13/2016   11:53 AM 01/10/2016    2:56 AM  CBC  WBC 3.4 - 10.8 x10E3/uL 6.1  7.0  8.4   Hemoglobin 11.1 - 15.9 g/dL 12.8  13.0  13.6   Hematocrit 34.0 - 46.6 % 40.2  38.6  38.1   Platelets 150 - 379 x10E3/uL 302  301  157       CMP     Component Value Date/Time   NA 137 07/04/2017 0931   K 4.7 07/04/2017 0931   CL 100 07/04/2017 0931   CO2 25 07/04/2017 0931   GLUCOSE 95 07/04/2017 0931   GLUCOSE 112 (H) 01/10/2016 0256   BUN 11 07/04/2017 0931   CREATININE 0.54 (L) 07/04/2017 0931   CALCIUM 9.0 07/04/2017 0931   PROT 7.1 01/10/2016 0256   ALBUMIN 4.2 07/04/2017 0931   AST 103 (H) 01/10/2016 0256   ALT 110 (H) 01/10/2016 0256   ALKPHOS 92 01/10/2016 0256   BILITOT 1.1 01/10/2016 0256   GFRNONAA 111 07/04/2017 0931   GFRAA 128 07/04/2017 0931     No results found.     Assessment & Plan:   1. Lymphedema Results reviewed with patient  I had a long discussion with the patient regarding lymphedema and the associated causes and symptoms associated.  We also discussed venous insufficiency and associated symptoms.  Following this discussion he elected that the patient will continue with conservative therapy  including medical grade compression, elevation and activity.  She will also continue use of her lymphedema pump.  We discussed associated symptoms varicose  veins such as more large veins that become tender and painful and achy.  Following this discussion the patient will continue to follow with physical therapy and we will plan to return in 3 months if there is any progression of symptoms.   Current Outpatient Medications on File Prior to Visit  Medication Sig Dispense Refill   acetaminophen (TYLENOL) 325 MG tablet Take by mouth.     Boswellia-Glucosamine-Vit D (OSTEO BI-FLEX ONE PER DAY PO) Take 2 tablets by mouth daily.     Clobetasol Prop Emollient Base (CLOBETASOL PROPIONATE E) 0.05 % emollient cream Apply 1 application topically 2 (two) times daily. 30 g 3   dicyclomine (BENTYL) 10 MG capsule Take 1 capsule (10 mg total) by mouth 3 (three) times daily. 90 capsule 11   EPINEPHrine 0.3 mg/0.3 mL IJ SOAJ injection INJECT INTRAMUSCULARLY AS DIRECTED     ibuprofen (ADVIL,MOTRIN) 200 MG tablet Take by mouth.     loratadine (CLARITIN) 10 MG tablet Take 1 tablet by mouth 1 day or 1 dose.     meloxicam (MOBIC) 15 MG tablet Take 15 mg by mouth daily.     Multiple Vitamins-Minerals (MULTIVITAMIN & MINERAL) LIQD Take 1 tablet by mouth 1 day or 1 dose. pill     pantoprazole (PROTONIX) 40 MG tablet Take 1 tablet (40 mg total) by mouth daily. 90 tablet 0   PARoxetine Mesylate 7.5 MG CAPS      potassium chloride (KCL) 2 mEq/mL SOLN oral liquid Take 1 tablet by mouth daily.     pseudoephedrine (SUDAFED) 120 MG 12 hr tablet Take 120 mg by mouth 2 (two) times daily.     Sodium Sulfate-Mag Sulfate-KCl (SUTAB) [id number] MG TABS Take 1 kit by mouth as directed. 24 tablet 0   No current facility-administered medications on file prior to visit.    There are no Patient Instructions on file for this visit. No follow-ups on file.   Kris Hartmann, NP

## 2022-06-26 ENCOUNTER — Encounter: Payer: Self-pay | Admitting: Podiatry

## 2022-06-27 ENCOUNTER — Other Ambulatory Visit: Payer: Self-pay | Admitting: Podiatry

## 2022-06-27 DIAGNOSIS — M19071 Primary osteoarthritis, right ankle and foot: Secondary | ICD-10-CM

## 2022-06-28 ENCOUNTER — Other Ambulatory Visit: Payer: Self-pay | Admitting: Podiatry

## 2022-06-28 DIAGNOSIS — M19071 Primary osteoarthritis, right ankle and foot: Secondary | ICD-10-CM

## 2022-07-12 ENCOUNTER — Ambulatory Visit
Admission: RE | Admit: 2022-07-12 | Discharge: 2022-07-12 | Disposition: A | Discharge status: Home / Self Care | Payer: BC Managed Care – PPO | Source: Ambulatory Visit | Attending: Podiatry | Admitting: Podiatry

## 2022-07-12 DIAGNOSIS — M19071 Primary osteoarthritis, right ankle and foot: Secondary | ICD-10-CM | POA: Insufficient documentation

## 2022-07-17 ENCOUNTER — Ambulatory Visit
Admission: RE | Admit: 2022-07-17 | Discharge: 2022-07-17 | Disposition: A | Discharge status: Home / Self Care | Payer: BC Managed Care – PPO | Source: Ambulatory Visit | Attending: Physician Assistant | Admitting: Physician Assistant

## 2022-07-17 DIAGNOSIS — Z1231 Encounter for screening mammogram for malignant neoplasm of breast: Secondary | ICD-10-CM | POA: Diagnosis present

## 2022-08-02 ENCOUNTER — Telehealth (INDEPENDENT_AMBULATORY_CARE_PROVIDER_SITE_OTHER): Payer: BC Managed Care – PPO | Admitting: Gastroenterology

## 2022-08-02 DIAGNOSIS — K219 Gastro-esophageal reflux disease without esophagitis: Secondary | ICD-10-CM | POA: Diagnosis not present

## 2022-08-02 MED ORDER — PANTOPRAZOLE SODIUM 40 MG PO TBEC: 40.0000 mg | DELAYED_RELEASE_TABLET | Freq: Every day | ORAL | 3 refills | Status: DC

## 2022-08-02 NOTE — Progress Notes (Signed)
Midge Minium, MD [address]  [address]  [place], [place] [address]  Main: [phone]  Fax: [phone]    Gastroenterology Virtual/Video Visit  Referring Provider:     Patrice Paradise, MD Primary Care Physician:  Patrice Paradise, MD Primary Gastroenterologist:  Dr.Aleister Lady Servando Snare Reason for Consultation:     Medication refill        HPI:    Virtual Visit via Video Note Location of the patient: Home Location of provider: Home Participating persons: The patient and myself.  I connected with Rachael Gonzales on 08/02/22 at  8:45 AM EDT by a video enabled telemedicine application and verified that I am speaking with the correct person using two identifiers.   I discussed the limitations of evaluation and management by telemedicine and the availability of in person appointments. The patient expressed understanding and agreed to proceed.  Verbal consent to proceed obtained.  History of Present Illness: Rachael Gonzales is a 57 y.o. female referred by Dr. Merlinda Frederick, Merleen Milliner, MD  for consultation & management of medication refill.  This patient had last seen me back in October 2022 and at that time needed a refill of her pantoprazole.  The patient had called for refill recently and was instructed to make a follow-up appoint with me since it had been sometime since she had seen me. The patient denies any unexplained weight loss fevers chills nausea vomiting black stools or bloody stools.  Past Medical History:  Diagnosis Date   Allergy    Depression    Diverticulitis    Headache    sinus   Irritable bowel    Venous insufficiency     Past Surgical History:  Procedure Laterality Date   BACK SURGERY  04/17/1990   L5-S1   CESAREAN SECTION     COLONOSCOPY WITH PROPOFOL N/A 03/03/2016   Procedure: COLONOSCOPY WITH PROPOFOL;  Surgeon: Midge Minium, MD;  Location: [facility];  Service: Endoscopy;  Laterality: N/A;   COLONOSCOPY WITH PROPOFOL N/A 03/01/2021    Procedure: COLONOSCOPY WITH PROPOFOL;  Surgeon: Midge Minium, MD;  Location: [facility] END[facility]COPY;  Service: Endoscopy;  Laterality: N/A;   ESOPHAGOGASTRODUODENOSCOPY (EGD) WITH PROPOFOL N/A 03/03/2016   Procedure: ESOPHAGOGASTRODUODENOSCOPY (EGD) WITH PROPOFOL;  Surgeon: Midge Minium, MD;  Location: [facility];  Service: Endoscopy;  Laterality: N/A;   POLYPECTOMY  03/03/2016   Procedure: POLYPECTOMY;  Surgeon: Midge Minium, MD;  Location: [facility];  Service: Endoscopy;;  gastric biopsy Descending colon polyp.     Prior to Admission medications   Medication Sig Start Date End Date Taking? Authorizing Provider  acetaminophen (TYLENOL) 325 MG tablet Take by mouth.    [provider]  Boswellia-Glucosamine-Vit D (OSTEO BI-FLEX ONE PER DAY PO) Take 2 tablets by mouth daily.    [provider]  Clobetasol Prop Emollient Base (CLOBETASOL PROPIONATE E) 0.05 % emollient cream Apply 1 application topically 2 (two) times daily. 03/06/18   Duanne Limerick, MD  dicyclomine (BENTYL) 10 MG capsule Take 1 capsule (10 mg total) by mouth 3 (three) times daily. 07/04/17   Duanne Limerick, MD  EPINEPHrine 0.3 mg/0.3 mL IJ SOAJ injection INJECT INTRAMUSCULARLY AS DIRECTED 05/01/18   [provider]  ibuprofen (ADVIL,MOTRIN) 200 MG tablet Take by mouth.    [provider]  loratadine (CLARITIN) 10 MG tablet Take 1 tablet by mouth 1 day or 1 dose.    [provider]  meloxicam (MOBIC) 15 MG tablet Take 15 mg  by mouth daily. 06/20/22 08/19/22  [provider]  Multiple Vitamins-Minerals (MULTIVITAMIN & MINERAL) LIQD Take 1 tablet by mouth 1 day or 1 dose. pill    [provider]  pantoprazole (PROTONIX) 40 MG tablet Take 1 tablet (40 mg total) by mouth daily. 05/18/22   Midge Minium, MD  PARoxetine Mesylate 7.5 MG CAPS  04/21/19   [provider]  potassium chloride (KCL) 2 mEq/mL SOLN oral liquid Take 1 tablet by mouth daily.    [provider]  pseudoephedrine (SUDAFED) 120 MG 12 hr tablet Take 120 mg by mouth 2 (two) times daily.    [provider]  Sodium Sulfate-Mag Sulfate-KCl (SUTAB) [phone] MG TABS Take 1 kit by mouth as directed. 02/01/21   Midge Minium, MD    Family History  Problem Relation Age of Onset   COPD Mother    Asthma Mother    Cancer Father        ? primary site (rare)   Cancer Maternal Grandmother        ? primary site   Sudden death Neg Hx    Hypertension Neg Hx    Heart attack Neg Hx    Diabetes Neg Hx    Hyperlipidemia Neg Hx    Breast cancer Neg Hx      Social History   Tobacco Use   Smoking status: Former    Types: Cigarettes    Quit date: 04/18/2011    Years since quitting: 11.2   Smokeless tobacco: Never  Substance Use Topics   Alcohol use: Yes    Alcohol/week: 3.0 standard drinks of alcohol    Types: 3 Shots of liquor per week   Drug use: No    Allergies as of 08/02/2022 - Review Complete 06/22/2022  Allergen Reaction Noted   Iodinated contrast media Hives 01/10/2016   Cat hair extract  04/18/2018   Dog epithelium (canis lupus familiaris) Cough 04/18/2018    Review of Systems:    All systems reviewed and negative except where noted in HPI.   Observations/Objective:  Labs: CBC    Component Value Date/Time   WBC 6.1 07/19/2016 1007   WBC 8.4 01/10/2016 0256   RBC 4.37 07/19/2016 1007   RBC 4.25 01/10/2016 0256   HGB 12.8 07/19/2016 1007   HCT 40.2 07/19/2016 1007   PLT 302 07/19/2016 1007   MCV 92 07/19/2016 1007   MCH 29.3 07/19/2016 1007   MCH 32.0 01/10/2016 0256   MCHC 31.8 07/19/2016 1007   MCHC 35.6 01/10/2016 0256   RDW 13.1 07/19/2016 1007   LYMPHSABS 1.4 07/19/2016 1007   EOSABS 0.1 07/19/2016 1007   BASOSABS 0.0 07/19/2016 1007   CMP     Component Value Date/Time   NA 137 07/04/2017 0931   K 4.7 07/04/2017 0931   CL 100 07/04/2017 0931   CO2 25 07/04/2017 0931   GLUCOSE 95 07/04/2017 0931   GLUCOSE 112 (H) 01/10/2016  0256   BUN 11 07/04/2017 0931   CREATININE 0.54 (L) 07/04/2017 0931   CALCIUM 9.0 07/04/2017 0931   PROT 7.1 01/10/2016 0256   ALBUMIN 4.2 07/04/2017 0931   AST 103 (H) 01/10/2016 0256   ALT 110 (H) 01/10/2016 0256   ALKPHOS 92 01/10/2016 0256   BILITOT 1.1 01/10/2016 0256   GFRNONAA 111 07/04/2017 0931   GFRAA 128 07/04/2017 0931    Imaging Studies: MM 3D SCREEN BREAST BILATERAL  Result Date: 07/18/2022 CLINICAL DATA:  Screening. EXAM: DIGITAL SCREENING BILATERAL MAMMOGRAM WITH  TOMOSYNTHESIS AND CAD TECHNIQUE: Bilateral screening digital craniocaudal and mediolateral oblique mammograms were obtained. Bilateral screening digital breast tomosynthesis was performed. The images were evaluated with computer-aided detection. COMPARISON:  Previous exam(s). ACR Breast Density Category b: There are scattered areas of fibroglandular density. FINDINGS: There are no findings suspicious for malignancy. IMPRESSION: No mammographic evidence of malignancy. A result letter of this screening mammogram will be mailed directly to the patient. RECOMMENDATION: Screening mammogram in one year. (Code:SM-B-01Y) BI-RADS CATEGORY  1: Negative. Electronically Signed   By: Sherian Rein M.D.   On: 07/18/2022 11:38   CT ANKLE RIGHT WO CONTRAST  Result Date: 07/14/2022 CLINICAL DATA:  Bilateral ankle pain EXAM: CT OF THE RIGHT ANKLE WITHOUT CONTRAST TECHNIQUE: Multidetector CT imaging of the right ankle was performed according to the standard protocol. Multiplanar CT image reconstructions were also generated. RADIATION DOSE REDUCTION: This exam was performed according to the departmental dose-optimization program which includes automated exposure control, adjustment of the mA and/or kV according to patient size and/or use of iterative reconstruction technique. COMPARISON:  None Available. FINDINGS: Bones/Joint/Cartilage No fracture or dislocation. Normal alignment. Small talonavicular joint effusion. Severe osteoarthritis of  the talonavicular joint. Loose body along the dorsal aspect of the talonavicular joint. Mild osteoarthritis of the calcaneocuboid joint. Moderate osteoarthritis of the middle cuneiform joint. Plantar calcaneal spur. Ligaments Ligaments are suboptimally evaluated by CT. Muscles and Tendons Muscles are normal. No muscle atrophy. No intramuscular fluid collection or hematoma. Flexor, extensor, peroneal and Achilles tendons are intact. Soft tissue No fluid collection or hematoma.  No soft tissue mass. IMPRESSION: 1. Severe osteoarthritis of the talonavicular joint. 2. Mild osteoarthritis of the calcaneocuboid joint. 3.  No acute osseous injury of the right ankle. Electronically Signed   By: Elige Ko M.D.   On: 07/14/2022 09:10   CT ANKLE LEFT WO CONTRAST  Result Date: 07/14/2022 CLINICAL DATA:  Bilateral ankle pain for years EXAM: CT OF THE LEFT ANKLE WITHOUT CONTRAST TECHNIQUE: Multidetector CT imaging of the left ankle was performed according to the standard protocol. Multiplanar CT image reconstructions were also generated. RADIATION DOSE REDUCTION: This exam was performed according to the departmental dose-optimization program which includes automated exposure control, adjustment of the mA and/or kV according to patient size and/or use of iterative reconstruction technique. COMPARISON:  None Available. FINDINGS: Bones/Joint/Cartilage No fracture or dislocation. Normal alignment. Small talonavicular joint effusion. Severe osteoarthritis of the talonavicular joint. Loose body along the dorsal aspect of the talonavicular joint. Mild osteoarthritis of the calcaneocuboid joint. Mild osteoarthritis of the middle cuneiform joint. Plantar calcaneal spur. Ligaments Ligaments are suboptimally evaluated by CT. Muscles and Tendons Muscles are normal. No muscle atrophy. No intramuscular fluid collection or hematoma. Flexor, extensor, peroneal and Achilles tendons are intact. Soft tissue No fluid collection or hematoma.   No soft tissue mass. IMPRESSION: 1. Severe osteoarthritis of the talonavicular joint. 2. Mild osteoarthritis of the calcaneocuboid joint. 3.  No acute osseous injury of the left ankle. Electronically Signed   By: Elige Ko M.D.   On: 07/14/2022 09:07    Assessment and Plan:   Rachael Gonzales is a 57 y.o. y/o female has been referred for refill medication.  The patient has been doing well on the pantoprazole.  She has not had any unexplained weight loss fevers chills nausea vomiting black stools or bloody stools.  The patient will have her medication refilled and will contact me if she is having any problems.  The patient has been explained the plan agrees  with it.  Follow Up Instructions:  I discussed the assessment and treatment plan with the patient. The patient was provided an opportunity to ask questions and all were answered. The patient agreed with the plan and demonstrated an understanding of the instructions.   The patient was advised to call back or seek an in-person evaluation if the symptoms worsen or if the condition fails to improve as anticipated.  I provided 20 minutes of non-face-to-face time during this encounter including chart review In preparation for the encounter.   Midge Minium, MD  Speech recognition software was used to dictate the above note.

## 2022-08-20 ENCOUNTER — Telehealth: Payer: BC Managed Care – PPO | Admitting: Nurse Practitioner

## 2022-08-20 DIAGNOSIS — T23222D Burn of second degree of single left finger (nail) except thumb, subsequent encounter: Secondary | ICD-10-CM

## 2022-08-20 MED ORDER — SILVER SULFADIAZINE 1 % EX CREA: 1.0000 | TOPICAL_CREAM | Freq: Two times a day (BID) | CUTANEOUS | 0 refills | Status: AC

## 2022-08-20 NOTE — Patient Instructions (Signed)
  Rachael Gonzales, thank you for joining Claiborne Rigg, NP for today's virtual visit.  While this provider is not your primary care provider (PCP), if your PCP is located in our provider database this encounter information will be shared with them immediately following your visit.   A [facility] MyChart account gives you access to today's visit and all your visits, tests, and labs performed at [facility] " click here if you don't have a [facility] MyChart account or go to [email or web address]  Consent: (Patient) Rachael Gonzales provided verbal consent for this virtual visit at the beginning of the encounter.  Current Medications:  Current Outpatient Medications:    silver sulfADIAZINE (SILVADENE) 1 % cream, Apply 1 Application topically 2 (two) times daily for 7 days., Disp: 50 g, Rfl: 0   acetaminophen (TYLENOL) 325 MG tablet, Take by mouth., Disp: , Rfl:    Boswellia-Glucosamine-Vit D (OSTEO BI-FLEX ONE PER DAY PO), Take 2 tablets by mouth daily., Disp: , Rfl:    EPINEPHrine 0.3 mg/0.3 mL IJ SOAJ injection, INJECT INTRAMUSCULARLY AS DIRECTED, Disp: , Rfl:    ibuprofen (ADVIL,MOTRIN) 200 MG tablet, Take by mouth., Disp: , Rfl:    loratadine (CLARITIN) 10 MG tablet, Take 1 tablet by mouth 1 day or 1 dose., Disp: , Rfl:    Multiple Vitamins-Minerals (MULTIVITAMIN & MINERAL) LIQD, Take 1 tablet by mouth 1 day or 1 dose. pill, Disp: , Rfl:    pantoprazole (PROTONIX) 40 MG tablet, Take 1 tablet (40 mg total) by mouth daily., Disp: 90 tablet, Rfl: 3   PARoxetine Mesylate 7.5 MG CAPS, , Disp: , Rfl:    potassium chloride (KCL) 2 mEq/mL SOLN oral liquid, Take 1 tablet by mouth daily., Disp: , Rfl:    pseudoephedrine (SUDAFED) 120 MG 12 hr tablet, Take 120 mg by mouth 2 (two) times daily., Disp: , Rfl:    Medications ordered in this encounter:  Meds ordered this encounter  Medications   silver sulfADIAZINE (SILVADENE) 1 % cream    Sig: Apply 1 Application topically 2 (two)  times daily for 7 days.    Dispense:  50 g    Refill:  0    Order Specific Question:   Supervising Provider    Answer:   Merrilee Jansky [id number]     *If you need refills on other medications prior to your next appointment, please contact your pharmacy*  Follow-Up: Call back or seek an in-person evaluation if the symptoms worsen or if the condition fails to improve as anticipated.  [facility] [phone]    If you have been instructed to have an in-person evaluation today at a local Urgent Care facility, please use the link below. It will take you to a list of all of our available [facility], including address, phone number and hours of operation. Please do not delay care.  [facility]  If you or a family member do not have a primary care provider, use the link below to schedule a visit and establish care. When you choose a [facility] primary care physician or advanced practice provider, you gain a long-term partner in health. Find a Primary Care Provider  Learn more about [facility]'s in-office and virtual care options: [facility] - Get Care Now

## 2022-08-20 NOTE — Progress Notes (Signed)
Virtual Visit Consent   [name], you are scheduled for a virtual visit with a [facility] provider today. Just as with appointments in the office, your consent must be obtained to participate. Your consent will be active for this visit and any virtual visit you may have with one of our providers in the next 365 days. If you have a MyChart account, a copy of this consent can be sent to you electronically.  As this is a virtual visit, video technology does not allow for your provider to perform a traditional examination. This may limit your provider's ability to fully assess your condition. If your provider identifies any concerns that need to be evaluated in person or the need to arrange testing (such as labs, EKG, etc.), we will make arrangements to do so. Although advances in technology are sophisticated, we cannot ensure that it will always work on either your end or our end. If the connection with a video visit is poor, the visit may have to be switched to a telephone visit. With either a video or telephone visit, we are not always able to ensure that we have a secure connection.  By engaging in this virtual visit, you consent to the provision of healthcare and authorize for your insurance to be billed (if applicable) for the services provided during this visit. Depending on your insurance coverage, you may receive a charge related to this service.  I need to obtain your verbal consent now. Are you willing to proceed with your visit today? Rachael Gonzales has provided verbal consent on 08/20/2022 for a virtual visit (video or telephone). Rachael Rigg, NP  Date: 08/20/2022 8:46 AM  Virtual Visit via Video Note   I, Rachael Gonzales, connected with  Rachael Gonzales  ([id number], [birth year]) on 08/20/22 at  8:45 AM EDT by a video-enabled telemedicine application and verified that I am speaking with the correct person using two identifiers.  Location: Patient: Virtual Visit Location Patient:  Home Provider: Virtual Visit Location Provider: Home Office   I discussed the limitations of evaluation and management by telemedicine and the availability of in person appointments. The patient expressed understanding and agreed to proceed.    History of Present Illness: Rachael Gonzales is a 57 y.o. who identifies as a female who was assigned female at birth, and is being seen today for second degree burn of right middle finger.  Rachael Gonzales burned the pads of her right middle and index finger with a hot glue gun over a week ago. The index finger has healed however the right middle finger developed a blister which opened during a shower a few days ago. Since then her right middle finger has started swelling with erythema and pain. There is no visible drainage from the finger. She denies any systemic symptoms of infection.     Problems:  Patient Active Problem List   Diagnosis Date Noted   Gastroesophageal reflux disease without esophagitis 04/18/2018   Hot flushes, perimenopausal 04/18/2018   Seasonal allergic rhinitis 04/18/2018   Lymphedema 04/05/2018   Varicose veins of both lower extremities with inflammation 12/28/2017   Swelling of limb 11/20/2017   BMI 40.0-44.9, adult (HCC) 07/19/2016   Heartburn    Reflux esophagitis    Acute gastritis without hemorrhage    Diverticulitis of colon (without mention of hemorrhage)(562.11)    Benign neoplasm of descending colon    Irritable bowel syndrome 08/14/2014   Adult BMI 30+ 08/14/2014   Acute situational disturbance  08/14/2014   Lateral epicondylitis of left elbow 07/04/2013    Allergies:  Allergies  Allergen Reactions   Iodinated Contrast Media Hives    CT A/P w on 01/10/16. Itching and hives.   Cat Hair Extract     Other reaction(s): Cough, Headache   Dog Epithelium (Canis Lupus Familiaris) Cough    Other reaction(s): Headache   Medications:  Current Outpatient Medications:    silver sulfADIAZINE (SILVADENE) 1 % cream, Apply 1  Application topically 2 (two) times daily for 7 days., Disp: 50 g, Rfl: 0   acetaminophen (TYLENOL) 325 MG tablet, Take by mouth., Disp: , Rfl:    Boswellia-Glucosamine-Vit D (OSTEO BI-FLEX ONE PER DAY PO), Take 2 tablets by mouth daily., Disp: , Rfl:    EPINEPHrine 0.3 mg/0.3 mL IJ SOAJ injection, INJECT INTRAMUSCULARLY AS DIRECTED, Disp: , Rfl:    ibuprofen (ADVIL,MOTRIN) 200 MG tablet, Take by mouth., Disp: , Rfl:    loratadine (CLARITIN) 10 MG tablet, Take 1 tablet by mouth 1 day or 1 dose., Disp: , Rfl:    Multiple Vitamins-Minerals (MULTIVITAMIN & MINERAL) LIQD, Take 1 tablet by mouth 1 day or 1 dose. pill, Disp: , Rfl:    pantoprazole (PROTONIX) 40 MG tablet, Take 1 tablet (40 mg total) by mouth daily., Disp: 90 tablet, Rfl: 3   PARoxetine Mesylate 7.5 MG CAPS, , Disp: , Rfl:    potassium chloride (KCL) 2 mEq/mL SOLN oral liquid, Take 1 tablet by mouth daily., Disp: , Rfl:    pseudoephedrine (SUDAFED) 120 MG 12 hr tablet, Take 120 mg by mouth 2 (two) times daily., Disp: , Rfl:   Observations/Objective: Patient is well-developed, well-nourished in no acute distress.  Resting comfortably  at home.  Head is normocephalic, atraumatic.  No labored breathing.  Speech is clear and coherent with logical content.  Patient is alert and oriented at baseline.    Assessment and Plan: 1. Burn second degree of single l finger except thumb, subs - silver sulfADIAZINE (SILVADENE) 1 % cream; Apply 1 Application topically 2 (two) times daily for 7 days.  Dispense: 50 g; Refill: 0   Follow Up Instructions: I discussed the assessment and treatment plan with the patient. The patient was provided an opportunity to ask questions and all were answered. The patient agreed with the plan and demonstrated an understanding of the instructions.  A copy of instructions were sent to the patient via MyChart unless otherwise noted below.    The patient was advised to call back or seek an in-person evaluation if  the symptoms worsen or if the condition fails to improve as anticipated.  Time:  I spent 12 minutes with the patient via telehealth technology discussing the above problems/concerns.    Rachael Rigg, NP

## 2022-11-20 ENCOUNTER — Ambulatory Visit
Admission: RE | Admit: 2022-11-20 | Discharge: 2022-11-20 | Disposition: A | Discharge status: Home / Self Care | Payer: BC Managed Care – PPO | Source: Ambulatory Visit | Attending: Emergency Medicine | Admitting: Emergency Medicine

## 2022-11-20 VITALS — BP 124/83 | HR 58 | Temp 98.3°F | Resp 16

## 2022-11-20 DIAGNOSIS — Z23 Encounter for immunization: Secondary | ICD-10-CM

## 2022-11-20 DIAGNOSIS — S61239A Puncture wound without foreign body of unspecified finger without damage to nail, initial encounter: Secondary | ICD-10-CM | POA: Diagnosis not present

## 2022-11-20 MED ORDER — TETANUS-DIPHTH-ACELL PERTUSSIS 5-2.5-18.5 LF-MCG/0.5 IM SUSY
0.5000 mL | PREFILLED_SYRINGE | Freq: Once | INTRAMUSCULAR | Status: AC
  Administered 2022-11-20: 0.5 mL via INTRAMUSCULAR

## 2022-11-20 MED ORDER — CEPHALEXIN 500 MG PO CAPS: 500.0000 mg | ORAL_CAPSULE | Freq: Three times a day (TID) | ORAL | 0 refills | Status: AC

## 2022-11-20 NOTE — Discharge Instructions (Addendum)
Keep the wound on your left index finger clean and dry.  Monitor for any increase in redness, swelling, pus drainage, red streaks going up your finger, or fever.  If any of those conditions above develop you need to be seen in the ER.  In the meantime, take the Keflex 500 mg 3 times a day with food for 7 days to prevent infection.  I do recommend taking over-the-counter probiotic such as Culturelle or align 1 hour after each dose of antibiotics while you are on antibiotic therapy.  If you develop any new or worsening symptoms you are working to return for reevaluation.

## 2022-11-20 NOTE — ED Triage Notes (Addendum)
Pt presents with a puncture wound to her left middle finger with a nail gun yesterday.n Her last tetanus was 07/19/2016

## 2022-11-20 NOTE — ED Provider Notes (Signed)
[facility]    CSN: [id number] Arrival date & time: 11/20/22  1157      History   Chief Complaint Chief Complaint  Patient presents with   Puncture Wound    I need to get a tetanus shot. Nail gun vs. finger. Left hand middle finger. - Entered by patient    HPI Rachael Gonzales is a 57 y.o. female.   HPI  57 year old female with a past medical history significant for diverticulitis, depression, IBS, GERD, and lymphedema presents for evaluation of a puncture wound to the tip of her left middle finger.  She sustained the wound yesterday when she was using a bread [employer]aylor to put up trim.  There is mild erythema to the distal phalanx along with a clear puncture wound and mild edema.  No pus drainage.  No fever.  Full range of motion of the finger.  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/28/2017   Swelling of limb 11/20/2017   BMI 40.0-44.9, adult (HCC) 07/19/2016   Heartburn    Reflux esophagitis    Acute gastritis without hemorrhage    Diverticulitis of colon (without mention of hemorrhage)(562.11)    Benign neoplasm of descending colon    Irritable bowel syndrome 08/14/2014   Adult BMI 30+ 08/14/2014   Acute situational disturbance 08/14/2014   Lateral epicondylitis of left elbow 07/04/2013    Past Surgical History:  Procedure Laterality Date   BACK SURGERY  04/17/1990   L5-S1   CESAREAN SECTION     COLONOSCOPY WITH PROPOFOL N/A 03/03/2016   Procedure: COLONOSCOPY WITH PROPOFOL;  Surgeon: Midge Minium, MD;  Location: [facility];  Service: Endoscopy;   Laterality: N/A;   COLONOSCOPY WITH PROPOFOL N/A 03/01/2021   Procedure: COLONOSCOPY WITH PROPOFOL;  Surgeon: Midge Minium, MD;  Location: [facility];  Service: Endoscopy;  Laterality: N/A;   ESOPHAGOGASTRODUODENOSCOPY (EGD) WITH PROPOFOL N/A 03/03/2016   Procedure: ESOPHAGOGASTRODUODENOSCOPY (EGD) WITH PROPOFOL;  Surgeon: Midge Minium, MD;  Location: [facility];  Service: Endoscopy;  Laterality: N/A;   POLYPECTOMY  03/03/2016   Procedure: POLYPECTOMY;  Surgeon: Midge Minium, MD;  Location: [facility];  Service: Endoscopy;;  gastric biopsy Descending colon polyp.     OB History     Gravida  1   Para  1   Term  1   Preterm      AB      Living  1      SAB      IAB      Ectopic      Multiple      Live Births  1            Home Medications    Prior to Admission medications   Medication Sig Start Date End Date Taking? Authorizing Provider  cephALEXin (KEFLEX) 500 MG capsule Take 1 capsule (500 mg total) by mouth 3 (three) times daily for 7 days. 11/20/22 11/27/22 Yes Becky Augusta, NP  acetaminophen (TYLENOL) 325 MG tablet Take by mouth.    [provider]  Boswellia-Glucosamine-Vit D (  OSTEO BI-FLEX ONE PER DAY PO) Take 2 tablets by mouth daily.    [provider]  EPINEPHrine 0.3 mg/0.3 mL IJ SOAJ injection INJECT INTRAMUSCULARLY AS DIRECTED 05/01/18   [provider]  ibuprofen (ADVIL,MOTRIN) 200 MG tablet Take by mouth.    [provider]  loratadine (CLARITIN) 10 MG tablet Take 1 tablet by mouth 1 day or 1 dose.    [provider]  Multiple Vitamins-Minerals (MULTIVITAMIN & MINERAL) LIQD Take 1 tablet by mouth 1 day or 1 dose. pill    [provider]  pantoprazole (PROTONIX) 40 MG tablet Take 1 tablet (40 mg total) by mouth daily. 08/02/22   Midge Minium, MD  PARoxetine Mesylate 7.5 MG CAPS  04/21/19   [provider]  potassium chloride (KCL) 2 mEq/mL SOLN oral liquid Take 1 tablet by  mouth daily.    [provider]  pseudoephedrine (SUDAFED) 120 MG 12 hr tablet Take 120 mg by mouth 2 (two) times daily.    [provider]    Family History Family History  Problem Relation Age of Onset   COPD Mother    Asthma Mother    Cancer Father        ? primary site (rare)   Cancer Maternal Grandmother        ? primary site   Sudden death Neg Hx    Hypertension Neg Hx    Heart attack Neg Hx    Diabetes Neg Hx    Hyperlipidemia Neg Hx    Breast cancer Neg Hx     Social History Social History   Tobacco Use   Smoking status: Former    Current packs/day: 0.00    Types: Cigarettes    Quit date: 04/18/2011    Years since quitting: 11.6   Smokeless tobacco: Never  Substance Use Topics   Alcohol use: Yes    Alcohol/week: 3.0 standard drinks of alcohol    Types: 3 Shots of liquor per week   Drug use: No     Allergies   Iodinated contrast media, Cat hair extract, and Dog epithelium (canis lupus familiaris)   Review of Systems Review of Systems  Constitutional:  Negative for fever.  Skin:  Positive for color change and wound.     Physical Exam Triage Vital Signs ED Triage Vitals  Encounter Vitals Group     BP      Systolic BP Percentile      Diastolic BP Percentile      Pulse      Resp      Temp      Temp src      SpO2      Weight      Height      Head Circumference      Peak Flow      Pain Score      Pain Loc      Pain Education      Exclude from Growth Chart    No data found.  Updated Vital Signs BP 124/83 (BP Location: Right Arm)   Pulse (!) 58   Temp 98.3 F (36.8 C) (Oral)   Resp 16   SpO2 96%   Visual Acuity Right Eye Distance:   Left Eye Distance:   Bilateral Distance:    Right Eye Near:   Left Eye Near:    Bilateral Near:     Physical Exam Vitals and nursing note reviewed.  Constitutional:      Appearance:  Normal appearance. She is not ill-appearing.  HENT:     Head: Normocephalic and atraumatic.   Musculoskeletal:        General: Swelling, tenderness and signs of injury present. Normal range of motion.  Skin:    General: Skin is warm and dry.     Capillary Refill: Capillary refill takes less than 2 seconds.     Findings: Erythema present.  Neurological:     General: No focal deficit present.     Mental Status: She is alert and oriented to person, place, and time.      UC Treatments / Results  Labs (all labs ordered are listed, but only abnormal results are displayed) Labs Reviewed - No data to display  EKG   Radiology No results found.  Procedures Procedures (including critical care time)  Medications Ordered in UC Medications  Tdap (BOOSTRIX) injection 0.5 mL (has no administration in time range)    Initial Impression / Assessment and Plan / UC Course  I have reviewed the triage vital signs and the nursing notes.  Pertinent labs & imaging results that were available during my care of the patient were reviewed by me and considered in my medical decision making (see chart for details).   Patient is a nontoxic-appearing 57 year old female presenting for evaluation of a puncture wound to the lateral aspect of the distal phalanx of the left middle finger.  Patient seen image above, there is some erythema as well as a clear puncture wound.  The area is mildly warm to touch but is not indurated or fluctuant.  No pus drainage.  Given the location I do not feel that there was any bony contact and the distal phalanx is not tender with palpation.  I do not feel that patient is a radiograph at this time.  We will update the patient's tetanus shot as her last tetanus shot was in 2018.  I will also discharge her home on Keflex 500 mg 3 times a day for 7 days to prevent infection.  Return and ER precautions reviewed.  Final Clinical Impressions(s) / UC Diagnoses   Final diagnoses:  Puncture wound of finger of left hand, initial encounter     Discharge Instructions       Keep the wound on your left index finger clean and dry.  Monitor for any increase in redness, swelling, pus drainage, red streaks going up your finger, or fever.  If any of those conditions above develop you need to be seen in the ER.  In the meantime, take the Keflex 500 mg 3 times a day with food for 7 days to prevent infection.  I do recommend taking over-the-counter probiotic such as Culturelle or align 1 hour after each dose of antibiotics while you are on antibiotic therapy.  If you develop any new or worsening symptoms you are working to return for reevaluation.     ED Prescriptions     Medication Sig Dispense Auth. Provider   cephALEXin (KEFLEX) 500 MG capsule Take 1 capsule (500 mg total) by mouth 3 (three) times daily for 7 days. 21 capsule Becky Augusta, NP      PDMP not reviewed this encounter.   Becky Augusta, NP 11/20/22 1229

## 2022-12-22 ENCOUNTER — Ambulatory Visit (INDEPENDENT_AMBULATORY_CARE_PROVIDER_SITE_OTHER): Payer: BC Managed Care – PPO | Admitting: Vascular Surgery

## 2022-12-22 ENCOUNTER — Encounter (INDEPENDENT_AMBULATORY_CARE_PROVIDER_SITE_OTHER): Payer: Self-pay | Admitting: Vascular Surgery

## 2022-12-22 VITALS — BP 104/73 | HR 60 | Resp 18 | Ht 69.5 in | Wt 325.0 lb

## 2022-12-22 DIAGNOSIS — I890 Lymphedema, not elsewhere classified: Secondary | ICD-10-CM | POA: Diagnosis not present

## 2022-12-22 DIAGNOSIS — M7989 Other specified soft tissue disorders: Secondary | ICD-10-CM

## 2022-12-22 NOTE — Assessment & Plan Note (Signed)
Under much better control!

## 2022-12-22 NOTE — Progress Notes (Signed)
MRN : [id number]  Rachael Gonzales is a 57 y.o. ([birth year]) female who presents with chief complaint of  Chief Complaint  Patient presents with   Follow-up    f/u in 6 months with no studies-  .  History of Present Illness: Patient returns today in follow up of leg swelling and lymphedema.  She has been much more active and walking more.  She has been drinking water diligently.  Her legs are doing quite well and at this point she is really having minimal swelling.  She is planning an upcoming flight and does use her compression socks with long drives or flights.  She has been wearing them less frequently as she really has not been troubled by swelling as of late.  Current Outpatient Medications  Medication Sig Dispense Refill   acetaminophen (TYLENOL) 325 MG tablet Take by mouth.     Boswellia-Glucosamine-Vit D (OSTEO BI-FLEX ONE PER DAY PO) Take 2 tablets by mouth daily.     EPINEPHrine 0.3 mg/0.3 mL IJ SOAJ injection INJECT INTRAMUSCULARLY AS DIRECTED     ibuprofen (ADVIL,MOTRIN) 200 MG tablet Take by mouth.     loratadine (CLARITIN) 10 MG tablet Take 1 tablet by mouth 1 day or 1 dose.     Multiple Vitamins-Minerals (MULTIVITAMIN & MINERAL) LIQD Take 1 tablet by mouth 1 day or 1 dose. pill     pantoprazole (PROTONIX) 40 MG tablet Take 1 tablet (40 mg total) by mouth daily. 90 tablet 3   PARoxetine (PAXIL) 10 MG tablet Take 10 mg by mouth daily.     PARoxetine Mesylate 7.5 MG CAPS      potassium chloride (KCL) 2 mEq/mL SOLN oral liquid Take 1 tablet by mouth daily.     pseudoephedrine (SUDAFED) 120 MG 12 hr tablet Take 120 mg by mouth 2 (two) times daily.     No current facility-administered medications for this visit.    Past Medical History:  Diagnosis Date   Allergy    Depression    Diverticulitis    Headache    sinus   Irritable bowel    Venous insufficiency     Past Surgical History:  Procedure Laterality Date   BACK SURGERY  04/17/1990   L5-S1   CESAREAN SECTION      COLONOSCOPY WITH PROPOFOL N/A 03/03/2016   Procedure: COLONOSCOPY WITH PROPOFOL;  Surgeon: Midge Minium, MD;  Location: [facility];  Service: Endoscopy;  Laterality: N/A;   COLONOSCOPY WITH PROPOFOL N/A 03/01/2021   Procedure: COLONOSCOPY WITH PROPOFOL;  Surgeon: Midge Minium, MD;  Location: [facility];  Service: Endoscopy;  Laterality: N/A;   ESOPHAGOGASTRODUODENOSCOPY (EGD) WITH PROPOFOL N/A 03/03/2016   Procedure: ESOPHAGOGASTRODUODENOSCOPY (EGD) WITH PROPOFOL;  Surgeon: Midge Minium, MD;  Location: [facility];  Service: Endoscopy;  Laterality: N/A;   POLYPECTOMY  03/03/2016   Procedure: POLYPECTOMY;  Surgeon: Midge Minium, MD;  Location: [facility];  Service: Endoscopy;;  gastric biopsy Descending colon polyp.      Social History   Tobacco Use   Smoking status: Former    Current packs/day: 0.00    Types: Cigarettes    Quit date: 04/18/2011    Years since quitting: 11.6   Smokeless tobacco: Never  Substance Use Topics   Alcohol use: Yes    Alcohol/week: 3.0 standard drinks of alcohol    Types: 3 Shots of liquor per week   Drug use: No       Family History  Problem Relation Age of Onset  COPD Mother    Asthma Mother    Cancer Father        ? primary site (rare)   Cancer Maternal Grandmother        ? primary site   Sudden death Neg Hx    Hypertension Neg Hx    Heart attack Neg Hx    Diabetes Neg Hx    Hyperlipidemia Neg Hx    Breast cancer Neg Hx      Allergies  Allergen Reactions   Iodinated Contrast Media Hives    CT A/P w on 01/10/16. Itching and hives.   Cat Hair Extract     Other reaction(s): Cough, Headache   Dog Epithelium (Canis Lupus Familiaris) Cough    Other reaction(s): Headache     REVIEW OF SYSTEMS (Negative unless checked)  Constitutional: [] Weight loss  [] Fever  [] Chills Cardiac: [] Chest pain   [] Chest pressure   [] Palpitations   [] Shortness of breath when laying flat   [] Shortness of breath at rest    [] Shortness of breath with exertion. Vascular:  [x] Pain in legs with walking   [] Pain in legs at rest   [] Pain in legs when laying flat   [] Claudication   [] Pain in feet when walking  [] Pain in feet at rest  [] Pain in feet when laying flat   [] History of DVT   [] Phlebitis   [] Swelling in legs   [x] Varicose veins   [] Non-healing ulcers Pulmonary:   [] Uses home oxygen   [] Productive cough   [] Hemoptysis   [] Wheeze  [] COPD   [] Asthma Neurologic:  [] Dizziness  [] Blackouts   [] Seizures   [] History of stroke   [] History of TIA  [] Aphasia   [] Temporary blindness   [] Dysphagia   [] Weakness or numbness in arms   [] Weakness or numbness in legs Musculoskeletal:  [x] Arthritis   [] Joint swelling   [] Joint pain   [] Low back pain Hematologic:  [] Easy bruising  [] Easy bleeding   [] Hypercoagulable state   [] Anemic   Gastrointestinal:  [] Blood in stool   [] Vomiting blood  [] Gastroesophageal reflux/heartburn   [] Abdominal pain Genitourinary:  [] Chronic kidney disease   [] Difficult urination  [] Frequent urination  [] Burning with urination   [] Hematuria Skin:  [] Rashes   [] Ulcers   [] Wounds Psychological:  [] History of anxiety   []  History of major depression.  Physical Examination  BP 104/73 (BP Location: Left Arm)   Pulse 60   Resp 18   Ht 5' 9.5" (1.765 m)   Wt (!) 325 lb (147.4 kg)   LMP 11/29/2020 (Approximate) Comment: Negative urine pregnancy 03-01-2021 (LG)  BMI 47.31 kg/m  Gen:  WD/WN, NAD Head: /AT, No temporalis wasting. Ear/Nose/Throat: Hearing grossly intact, nares w/o erythema or drainage Eyes: Conjunctiva clear. Sclera non-icteric Neck: Supple.  Trachea midline Pulmonary:  Good air movement, no use of accessory muscles.  Cardiac: RRR, no JVD Vascular:  Vessel Right Left  Radial Palpable Palpable                          PT Palpable Palpable  DP Palpable Palpable   Gastrointestinal: soft, non-tender/non-distended. No guarding/reflex.  Musculoskeletal: M/S 5/5 throughout.  No  deformity or atrophy.  No appreciable edema this morning. Neurologic: Sensation grossly intact in extremities.  Symmetrical.  Speech is fluent.  Psychiatric: Judgment intact, Mood & affect appropriate for pt's clinical situation. Dermatologic: No rashes or ulcers noted.  No cellulitis or open wounds.      Labs No results found for this  or any previous visit (from the past 2160 hour(s)).  Radiology No results found.  Assessment/Plan  Swelling of limb Under much better control  Lymphedema Continue to use her lymphedema pump, exercise, elevate, and drink plenty of water.  If her swelling worsens, she will need to wear her compression socks more diligently.  She will use them for travel.  Her swelling is under great control and at current I think we can go to an annual follow-up.    Festus Barren, MD  12/22/2022 11:41 AM    This note was created with Dragon medical transcription system.  Any errors from dictation are purely unintentional

## 2022-12-22 NOTE — Assessment & Plan Note (Signed)
Continue to use her lymphedema pump, exercise, elevate, and drink plenty of water.  If her swelling worsens, she will need to wear her compression socks more diligently.  She will use them for travel.  Her swelling is under great control and at current I think we can go to an annual follow-up.

## 2023-04-20 ENCOUNTER — Telehealth: Payer: Self-pay | Admitting: Gastroenterology

## 2023-04-20 NOTE — Telephone Encounter (Signed)
 Last Rx 07/2022 was for 1 yr supply. Pt should have enough until appt

## 2023-04-20 NOTE — Telephone Encounter (Signed)
 the patient called in to schedule an appointment. I sent out an appointment reminder with a No-Show letter and a map. I verified and updated the information in her chart, including the insurance guarantor and other relevant details. The medication (Protonix ) 40 MG and her is Pharmacy is [facility] at [address], [place], [place] [address]. The schedule her with [place].

## 2023-05-03 ENCOUNTER — Encounter: Payer: Self-pay | Admitting: Physician Assistant

## 2023-05-03 ENCOUNTER — Telehealth: Payer: BC Managed Care – PPO | Admitting: Physician Assistant

## 2023-05-03 DIAGNOSIS — K2100 Gastro-esophageal reflux disease with esophagitis, without bleeding: Secondary | ICD-10-CM | POA: Diagnosis not present

## 2023-05-03 MED ORDER — PANTOPRAZOLE SODIUM 40 MG PO TBEC: 40.0000 mg | DELAYED_RELEASE_TABLET | Freq: Every day | ORAL | 3 refills | Status: DC

## 2023-05-03 NOTE — Patient Instructions (Signed)
Very nice to meet you today!  I refilled your Pantoprazole 40mg  1 tablet daily.  Recommend Lifestyle Modifications to prevent Acid Reflux.  Rec. Avoid coffee, sodas, peppermint, garlic, onions, alcohol, citrus fruits, chocolate, tomatoes, fatty and spicey foods.  Avoid eating 2-3 hours before bedtime.   Followup in 1 year or sooner if you develop any GI symptoms.  -Inetta Fermo

## 2023-05-03 NOTE — Progress Notes (Signed)
Rachael Amy, PA-C [address]  [address]  [place], [place] [address]  Main: [phone]  Fax: [phone]   Primary Care Physician: Rachael Paradise, MD  Virtual Visit via Video Note  I connected with patient on 05/03/23 at  2:30 PM EST by video and verified that I am speaking with the correct person using two identifiers.   I discussed the limitations, risks, security and privacy concerns of performing an evaluation and management service by video  and the availability of in person appointments. I also discussed with the patient that there may be a patient responsible charge related to this service. The patient expressed understanding and agreed to proceed.  Location of Patient: Home Location of Provider: Home Persons involved: Patient and provider only   History of Present Illness:  CC: F/U GERD  HPI: Rachael Gonzales is a 58 y.o. female, established patient of Rachael Gonzales, presents for annual follow-up of acid reflux.  Last follow-up with Rachael Gonzales 07/2022.  She is taking pantoprazole 40 Mg once daily with good control of acid reflux.  Needs medication refill.  She denies dysphagia or breakthrough heartburn on medication.  She tried to wean off PPI, however had recurrent heartburn if she missed 1-2 doses.  She wants to continue medication.  No GI concerns today.  02/2021 colonoscopy: 1 small 3 mm polyp removed.  7-year repeat.  02/2016 colonoscopy: 1 small 5mm tubular adenoma polyp removed.  02/2016 EGD: LA grade B esophagitis, moderate gastritis.  Bx negative for H. Pylori.  01/2023: CBC, CMP normal.  Hemoglobin 13.2.    Current Outpatient Medications  Medication Sig Dispense Refill   acetaminophen (TYLENOL) 325 MG tablet Take by mouth.     Boswellia-Glucosamine-Vit D (OSTEO BI-FLEX ONE PER DAY PO) Take 2 tablets by mouth daily.     EPINEPHrine 0.3 mg/0.3 mL IJ SOAJ injection INJECT INTRAMUSCULARLY AS DIRECTED     ibuprofen (ADVIL,MOTRIN) 200 MG tablet Take  by mouth.     loratadine (CLARITIN) 10 MG tablet Take 1 tablet by mouth 1 day or 1 dose.     Multiple Vitamins-Minerals (MULTIVITAMIN & MINERAL) LIQD Take 1 tablet by mouth 1 day or 1 dose. pill     pantoprazole (PROTONIX) 40 MG tablet Take 1 tablet (40 mg total) by mouth daily. 90 tablet 3   PARoxetine (PAXIL) 10 MG tablet Take 10 mg by mouth daily.     PARoxetine Mesylate 7.5 MG CAPS      potassium chloride (KCL) 2 mEq/mL SOLN oral liquid Take 1 tablet by mouth daily.     pseudoephedrine (SUDAFED) 120 MG 12 hr tablet Take 120 mg by mouth 2 (two) times daily.     No current facility-administered medications for this visit.    Allergies as of 05/03/2023 - Review Complete 12/22/2022  Allergen Reaction Noted   Iodinated contrast media Hives 01/10/2016   Cat dander  04/18/2018   Dog epithelium (canis lupus familiaris) Cough 04/18/2018    Review of Systems:    All systems reviewed and negative except where noted in HPI.   General Appearance:    Alert, cooperative, no distress, appears stated age  Head:    Normocephalic, without obvious abnormality, atraumatic  Eyes:    PERRL, conjunctiva/corneas clear,  Ears:    Grossly normal hearing    Neurologic:  Grossly normal    Observations/Objective:  Labs: CMP     Component Value Date/Time   NA 137 07/04/2017 0931   K 4.7 07/04/2017  0931   CL 100 07/04/2017 0931   CO2 25 07/04/2017 0931   GLUCOSE 95 07/04/2017 0931   GLUCOSE 112 (H) 01/10/2016 0256   BUN 11 07/04/2017 0931   CREATININE 0.54 (L) 07/04/2017 0931   CALCIUM 9.0 07/04/2017 0931   PROT 7.1 01/10/2016 0256   ALBUMIN 4.2 07/04/2017 0931   AST 103 (H) 01/10/2016 0256   ALT 110 (H) 01/10/2016 0256   ALKPHOS 92 01/10/2016 0256   BILITOT 1.1 01/10/2016 0256   GFRNONAA 111 07/04/2017 0931   GFRAA 128 07/04/2017 0931   Lab Results  Component Value Date   WBC 6.1 07/19/2016   HGB 12.8 07/19/2016   HCT 40.2 07/19/2016   MCV 92 07/19/2016   PLT 302 07/19/2016     Imaging Studies: No results found.  Assessment and Plan:   Rachael Gonzales is a 58 y.o. y/o female returns for followup of:  GERD Rx Pantoprazole 40mg  1 tablet daily, #90, 3 RF. Recommend Lifestyle Modifications to prevent Acid Reflux.  Rec. Avoid coffee, sodas, peppermint, garlic, onions, alcohol, citrus fruits, chocolate, tomatoes, fatty and spicey foods.  Avoid eating 2-3 hours before bedtime.    I discussed the assessment and treatment plan with the patient. The patient was provided an opportunity to ask questions and all were answered. The patient agreed with the plan and demonstrated an understanding of the instructions.   The patient was advised to call back or seek an in-person evaluation if the symptoms worsen or if the condition fails to improve as anticipated.  Followup in 1 year or sooner if she develops GI symptoms.  I provided 10 minutes of face-to-face time during this encounter.  Total time including reviewing chart and completing documentation 25 minutes.  Rachael Amy, PA-C [facility]   Speech recognition software was used to dictate this note.

## 2023-06-08 ENCOUNTER — Other Ambulatory Visit: Payer: Self-pay | Admitting: Physician Assistant

## 2023-06-08 DIAGNOSIS — Z1231 Encounter for screening mammogram for malignant neoplasm of breast: Secondary | ICD-10-CM

## 2023-07-19 ENCOUNTER — Ambulatory Visit: Payer: BC Managed Care – PPO

## 2023-08-08 ENCOUNTER — Ambulatory Visit
Admission: RE | Admit: 2023-08-08 | Discharge: 2023-08-08 | Disposition: A | Discharge status: Home / Self Care | Source: Ambulatory Visit | Attending: Physician Assistant | Admitting: Physician Assistant

## 2023-08-08 DIAGNOSIS — Z1231 Encounter for screening mammogram for malignant neoplasm of breast: Secondary | ICD-10-CM | POA: Diagnosis present

## 2023-09-04 ENCOUNTER — Encounter (INDEPENDENT_AMBULATORY_CARE_PROVIDER_SITE_OTHER): Payer: Self-pay

## 2023-11-06 ENCOUNTER — Ambulatory Visit (INDEPENDENT_AMBULATORY_CARE_PROVIDER_SITE_OTHER): Admitting: Vascular Surgery

## 2023-11-06 ENCOUNTER — Encounter (INDEPENDENT_AMBULATORY_CARE_PROVIDER_SITE_OTHER): Payer: Self-pay | Admitting: Vascular Surgery

## 2023-11-06 VITALS — BP 130/71 | HR 62 | Resp 18 | Wt 316.4 lb

## 2023-11-06 DIAGNOSIS — M7989 Other specified soft tissue disorders: Secondary | ICD-10-CM

## 2023-11-06 DIAGNOSIS — I890 Lymphedema, not elsewhere classified: Secondary | ICD-10-CM

## 2023-11-06 NOTE — Progress Notes (Signed)
 MRN : [id number]  Rachael Gonzales is a 58 y.o. (11/12/1965) female who presents with chief complaint of  Chief Complaint  Patient presents with   Follow-up    30yr follow up  .  History of Present Illness: Patient returns today in follow up of her lymphedema and leg swelling.  She is doing well today.  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  As such, she is not actively using it regularly.  This has also made it difficult for her to get on standard compression hose.  These were also quite painful.  No new ulceration or infection.  She has been working with physical therapy for the past 3 months, and that has helped her with pain and swelling in the legs and markedly improved her mobility.  Current Outpatient Medications  Medication Sig Dispense Refill   acetaminophen  (TYLENOL ) 325 MG tablet Take by mouth.     Boswellia-Glucosamine-Vit D (OSTEO BI-FLEX ONE PER DAY PO) Take 2 tablets by mouth daily.     EPINEPHrine 0.3 mg/0.3 mL IJ SOAJ injection INJECT INTRAMUSCULARLY AS DIRECTED     ibuprofen (ADVIL,MOTRIN) 200 MG tablet Take by mouth.     loratadine (CLARITIN) 10 MG tablet Take 1 tablet by mouth 1 day or 1 dose.     Multiple Vitamins-Minerals (MULTIVITAMIN & MINERAL) LIQD Take 1 tablet by mouth 1 day or 1 dose. pill     pantoprazole  (PROTONIX ) 40 MG tablet Take 1 tablet (40 mg total) by mouth daily. 90 tablet 3   PARoxetine (PAXIL) 10 MG tablet Take 10 mg by mouth daily.     PARoxetine Mesylate 7.5 MG CAPS      potassium chloride (KCL) 2 mEq/mL SOLN oral liquid Take 1 tablet by mouth daily.     pseudoephedrine (SUDAFED) 120 MG 12 hr tablet Take 120 mg by mouth 2 (two) times daily.     No current facility-administered medications for this visit.    Past Medical History:  Diagnosis Date   Allergy    Depression    Diverticulitis    Headache    sinus   Irritable bowel    Venous  insufficiency     Past Surgical History:  Procedure Laterality Date   BACK SURGERY  04/17/1990   L5-S1   CESAREAN SECTION     COLONOSCOPY WITH PROPOFOL  N/A 03/03/2016   Procedure: COLONOSCOPY WITH PROPOFOL ;  Surgeon: Rogelia Copping, MD;  Location: [facility];  Service: Endoscopy;  Laterality: N/A;   COLONOSCOPY WITH PROPOFOL  N/A 03/01/2021   Procedure: COLONOSCOPY WITH PROPOFOL ;  Surgeon: Copping Rogelia, MD;  Location: [facility];  Service: Endoscopy;  Laterality: N/A;   ESOPHAGOGASTRODUODENOSCOPY (EGD) WITH PROPOFOL  N/A 03/03/2016   Procedure: ESOPHAGOGASTRODUODENOSCOPY (EGD) WITH PROPOFOL ;  Surgeon: Rogelia Copping, MD;  Location: [facility];  Service: Endoscopy;  Laterality: N/A;   POLYPECTOMY  03/03/2016   Procedure: POLYPECTOMY;  Surgeon: Rogelia Copping, MD;  Location: [facility];  Service: Endoscopy;;  gastric biopsy Descending colon polyp.      Social History   Tobacco Use   Smoking status: Former    Current packs/day: 0.00    Types: Cigarettes    Quit date: 04/18/2011    Years since quitting: 12.5   Smokeless tobacco: Never  Substance Use Topics   Alcohol use: Yes    Alcohol/week: 3.0 standard drinks of alcohol    Types: 3 Shots of liquor per  week   Drug use: No      Family History  Problem Relation Age of Onset   COPD Mother    Asthma Mother    Cancer Father        ? primary site (rare)   Cancer Maternal Grandmother        ? primary site   Sudden death Neg Hx    Hypertension Neg Hx    Heart attack Neg Hx    Diabetes Neg Hx    Hyperlipidemia Neg Hx    Breast cancer Neg Hx      Allergies  Allergen Reactions   Iodinated Contrast Media Hives    CT A/P w on 01/10/16. Itching and hives.   Cat Dander     Other reaction(s): Cough, Headache   Dog Epithelium (Canis Lupus Familiaris) Cough    Other reaction(s): Headache     REVIEW OF SYSTEMS (Negative unless checked)   Constitutional: [x] Weight loss  [] Fever  [] Chills Cardiac: [] Chest  pain   [] Chest pressure   [] Palpitations   [] Shortness of breath when laying flat   [] Shortness of breath at rest   [] Shortness of breath with exertion. Vascular:  [x] Pain in legs with walking   [] Pain in legs at rest   [] Pain in legs when laying flat   [] Claudication   [] Pain in feet when walking  [x] Pain in feet at rest  [] Pain in feet when laying flat   [] History of DVT   [] Phlebitis   [] Swelling in legs   [x] Varicose veins   [] Non-healing ulcers Pulmonary:   [] Uses home oxygen   [] Productive cough   [] Hemoptysis   [] Wheeze  [] COPD   [] Asthma Neurologic:  [] Dizziness  [] Blackouts   [] Seizures   [] History of stroke   [] History of TIA  [] Aphasia   [] Temporary blindness   [] Dysphagia   [] Weakness or numbness in arms   [] Weakness or numbness in legs Musculoskeletal:  [x] Arthritis   [] Joint swelling   [x] Joint pain   [] Low back pain Hematologic:  [] Easy bruising  [] Easy bleeding   [] Hypercoagulable state   [] Anemic   Gastrointestinal:  [] Blood in stool   [] Vomiting blood  [] Gastroesophageal reflux/heartburn   [] Abdominal pain Genitourinary:  [] Chronic kidney disease   [] Difficult urination  [] Frequent urination  [] Burning with urination   [] Hematuria Skin:  [] Rashes   [] Ulcers   [] Wounds Psychological:  [] History of anxiety   []  History of major depression.  Physical Examination  BP 130/71   Pulse 62   Resp 18   Wt (!) 316 lb 6.4 oz (143.5 kg)   LMP 11/29/2020 (Approximate) Comment: Negative urine pregnancy 03-01-2021 ([facility])  BMI 46.05 kg/m  Gen:  WD/WN, NAD. Obese  Head: [place]/AT, No temporalis wasting. Ear/Nose/Throat: Hearing grossly intact, nares w/o erythema or drainage Eyes: Conjunctiva clear. Sclera non-icteric Neck: Supple.  Trachea midline Pulmonary:  Good air movement, no use of accessory muscles.  Cardiac: RRR, no JVD Vascular:  Vessel Right Left  Radial Palpable Palpable           Musculoskeletal: M/S 5/5 throughout.  No deformity or atrophy. Trace BLE edema. Neurologic:  Sensation grossly intact in extremities.  Symmetrical.  Speech is fluent.  Psychiatric: Judgment intact, Mood & affect appropriate for pt's clinical situation. Dermatologic: No rashes or ulcers noted.  No cellulitis or open wounds.      Labs No results found for this or any previous visit (from the past 2160 hours).  Radiology No results found.  Assessment/Plan  Lymphedema At  this point, her conventional lymphedema pump is causing too much pain because of the pressure of the foot and ankle.  There are many different varieties of pumps to choose from, and I think it would be prudent to have the company come evaluate her for a different pump that may suit her better.  She has gained dramatic improvement in terms of her pain and swelling over the years by using the pump, and not being able to use it has worsened her pain and swelling so over time.  She is also going to try to get a different type of compression garment that does not bother her feet and ankles as much and I think that is very reasonable.  If we can facilitate any of these issues particularly with the pump, she will contact our office.  We will try to get in touch with the company to come out and evaluate her.  I will plan on seeing her in an annual basis but can see her anytime sooner going forward if need be.  Swelling of limb A little worse due to inability to use her lymphedema pump and compression socks as previous.  Has been very active and this has helped get her pain and swelling under control.    Selinda Gu, MD  11/06/2023 9:42 AM    This note was created with Dragon medical transcription system.  Any errors from dictation are purely unintentional

## 2023-11-06 NOTE — Assessment & Plan Note (Signed)
 At this point, her conventional lymphedema pump is causing too much pain because of the pressure of the foot and ankle.  There are many different varieties of pumps to choose from, and I think it would be prudent to have the company come evaluate her for a different pump that may suit her better.  She has gained dramatic improvement in terms of her pain and swelling over the years by using the pump, and not being able to use it has worsened her pain and swelling so over time.  She is also going to try to get a different type of compression garment that does not bother her feet and ankles as much and I think that is very reasonable.  If we can facilitate any of these issues particularly with the pump, she will contact our office.  We will try to get in touch with the company to come out and evaluate her.  I will plan on seeing her in an annual basis but can see her anytime sooner going forward if need be.

## 2023-11-06 NOTE — Assessment & Plan Note (Signed)
 A little worse due to inability to use her lymphedema pump and compression socks as previous.  Has been very active and this has helped get her pain and swelling under control.

## 2023-12-25 ENCOUNTER — Ambulatory Visit (INDEPENDENT_AMBULATORY_CARE_PROVIDER_SITE_OTHER): Payer: BC Managed Care – PPO | Admitting: Vascular Surgery

## 2024-05-13 ENCOUNTER — Other Ambulatory Visit: Payer: Self-pay | Admitting: Physician Assistant

## 2024-05-13 DIAGNOSIS — K2100 Gastro-esophageal reflux disease with esophagitis, without bleeding: Secondary | ICD-10-CM

## 2024-11-04 ENCOUNTER — Ambulatory Visit (INDEPENDENT_AMBULATORY_CARE_PROVIDER_SITE_OTHER): Admitting: Vascular Surgery
